# Patient Record
Sex: Male | Born: 1970 | Race: White | Hispanic: No | Marital: Married | Smoking: Former smoker
Health system: Southern US, Community
[De-identification: ages and names within clinical notes are randomized; demographics above are authoritative.]

## PROBLEM LIST (undated history)

## (undated) DIAGNOSIS — H8103 Meniere's disease, bilateral: Secondary | ICD-10-CM

## (undated) DIAGNOSIS — J45909 Unspecified asthma, uncomplicated: Secondary | ICD-10-CM

## (undated) DIAGNOSIS — Z91038 Other insect allergy status: Secondary | ICD-10-CM

## (undated) DIAGNOSIS — Z91030 Bee allergy status: Secondary | ICD-10-CM

## (undated) DIAGNOSIS — T7840XA Allergy, unspecified, initial encounter: Secondary | ICD-10-CM

## (undated) HISTORY — DX: Unspecified asthma, uncomplicated: J45.909

## (undated) HISTORY — DX: Meniere's disease, bilateral: H81.03

## (undated) HISTORY — PX: TYMPANOSTOMY TUBE PLACEMENT: SHX32

## (undated) HISTORY — DX: Other insect allergy status: Z91.038

## (undated) HISTORY — DX: Allergy, unspecified, initial encounter: T78.40XA

## (undated) HISTORY — DX: Bee allergy status: Z91.030

---

## 2009-02-01 ENCOUNTER — Emergency Department (HOSPITAL_COMMUNITY): Admission: EM | Admit: 2009-02-01 | Discharge: 2009-02-01 | Payer: Self-pay | Admitting: Family Medicine

## 2012-07-11 ENCOUNTER — Ambulatory Visit (INDEPENDENT_AMBULATORY_CARE_PROVIDER_SITE_OTHER): Payer: 59 | Admitting: Family Medicine

## 2012-07-11 VITALS — BP 130/78 | HR 61 | Temp 98.4°F | Resp 16 | Ht 72.0 in | Wt 174.0 lb

## 2012-07-11 DIAGNOSIS — H00019 Hordeolum externum unspecified eye, unspecified eyelid: Secondary | ICD-10-CM

## 2012-07-11 MED ORDER — TOBRAMYCIN 0.3 % OP SOLN: 1.0000 [drp] | Freq: Four times a day (QID) | OPHTHALMIC | Status: DC

## 2012-07-11 NOTE — Patient Instructions (Addendum)
Sty  A sty (hordeolum) is an infection of a gland in the eyelid located at the base of the eyelash. A sty may develop a white or yellow head of pus. It can be puffy (swollen). Usually, the sty will burst and pus will come out on its own. They do not leave lumps in the eyelid once they drain.  A sty is often confused with another form of cyst of the eyelid called a chalazion. Chalazions occur within the eyelid and not on the edge where the bases of the eyelashes are. They often are red, sore and then form firm lumps in the eyelid.  CAUSES    Germs (bacteria).   Lasting (chronic) eyelid inflammation.  SYMPTOMS    Tenderness, redness and swelling along the edge of the eyelid at the base of the eyelashes.   Sometimes, there is a white or yellow head of pus. It may or may not drain.  DIAGNOSIS   An ophthalmologist will be able to distinguish between a sty and a chalazion and treat the condition appropriately.   TREATMENT    Styes are typically treated with warm packs (compresses) until drainage occurs.   In rare cases, medicines that kill germs (antibiotics) may be prescribed. These antibiotics may be in the form of drops, cream or pills.   If a hard lump has formed, it is generally necessary to do a small incision and remove the hardened contents of the cyst in a minor surgical procedure done in the office.   In suspicious cases, your caregiver may send the contents of the cyst to the lab to be certain that it is not a rare, but dangerous form of cancer of the glands of the eyelid.  HOME CARE INSTRUCTIONS    Wash your hands often and dry them with a clean towel. Avoid touching your eyelid. This may spread the infection to other parts of the eye.   Apply heat to your eyelid for 10 to 20 minutes, several times a day, to ease pain and help to heal it faster.   Do not squeeze the sty. Allow it to drain on its own. Wash your eyelid carefully 3 to 4 times per day to remove any pus.  SEEK IMMEDIATE MEDICAL CARE IF:     Your eye becomes painful or puffy (swollen).   Your vision changes.   Your sty does not drain by itself within 3 days.   Your sty comes back within a short period of time, even with treatment.   You have redness (inflammation) around the eye.   You have a fever.  Document Released: 04/21/2005 Document Revised: 10/04/2011 Document Reviewed: 12/24/2008  [employer] Patient Information 2013 [employer].

## 2012-07-11 NOTE — Progress Notes (Signed)
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 This is uncomfortable.  Objective:  NAD Mildly swollen lower lid with erythema (left side, lateral lid margin)  flourescein negative EOM I PERLA  Assessment: small stye

## 2012-12-20 ENCOUNTER — Ambulatory Visit (INDEPENDENT_AMBULATORY_CARE_PROVIDER_SITE_OTHER): Payer: 59 | Admitting: Family Medicine

## 2012-12-20 VITALS — BP 120/80 | HR 62 | Temp 98.1°F | Resp 16 | Ht 71.0 in | Wt 169.8 lb

## 2012-12-20 DIAGNOSIS — R05 Cough: Secondary | ICD-10-CM

## 2012-12-20 DIAGNOSIS — J309 Allergic rhinitis, unspecified: Secondary | ICD-10-CM

## 2012-12-20 DIAGNOSIS — R059 Cough, unspecified: Secondary | ICD-10-CM

## 2012-12-20 DIAGNOSIS — J019 Acute sinusitis, unspecified: Secondary | ICD-10-CM

## 2012-12-20 NOTE — Progress Notes (Signed)
[facility]:  Office Visit  Chief Complaint:  Chief Complaint  Patient presents with  . Sinusitis    drainage; very congested x 1 week   . Headache  . Cough    green- yellow sputum    HPI: Brad Price is a 42 y.o. male who complains of  2 week history of cough with green sputum and also sinus pressure and facial pain that has worsened in the last 1 week.  History of asthma, allergies. + Frontal HA. Has tried Mucinex and Flonase and Dulera without improvement. Former smoker. Denies SOB/wheezes, CP  Past Medical History  Diagnosis Date  . Allergy   . Asthma    History reviewed. No pertinent past surgical history. History   Social History  . Marital Status: Married    Spouse Name: N/A    Number of Children: N/A  . Years of Education: N/A   Social History Main Topics  . Smoking status: Former Games developer  . Smokeless tobacco: None  . Alcohol Use: Yes  . Drug Use: No  . Sexually Active: Yes   Other Topics Concern  . None   Social History Narrative  . None   Family History  Problem Relation Age of Onset  . COPD Father   . Cancer Maternal Grandfather   . Heart disease Paternal Grandfather    No Known Allergies Prior to Admission medications   Medication Sig Start Date End Date Taking? Authorizing Provider  fluticasone (FLONASE) 50 MCG/ACT nasal spray Place 2 sprays into the nose daily.   Yes Historical Provider, MD  mometasone-formoterol (DULERA) 100-5 MCG/ACT AERO Inhale 2 puffs into the lungs.   Yes Historical Provider, MD  tobramycin (TOBREX) 0.3 % ophthalmic solution Place 1 drop into the left eye every 6 (six) hours. 07/11/12   Elvina Sidle, MD     ROS: The patient denies night sweats, unintentional weight loss, chest pain, palpitations, wheezing, dyspnea on exertion, nausea, vomiting, abdominal pain, dysuria, hematuria, melena, numbness, weakness, or tingling.   All other systems have been reviewed and were otherwise negative with the  exception of those mentioned in the HPI and as above.    PHYSICAL EXAM: Filed Vitals:   12/20/12 1823  BP: 120/80  Pulse: 62  Temp: 98.1 F (36.7 C)  Resp: 16  Spo2   98% Filed Vitals:   12/20/12 1823  Height: 5\' 11"  (1.803 m)  Weight: 169 lb 12.8 oz (77.021 kg)   Body mass index is 23.69 kg/(m^2).  General: Alert, no acute distress HEENT:  Normocephalic, atraumatic, oropharynx patent. + Tm normal with scarring. + boggy nares, + facial and sinus tenderness bilaterally. No exudates. Cardiovascular:  Regular rate and rhythm, no rubs murmurs or gallops.  No Carotid bruits, radial pulse intact. No pedal edema.  Respiratory: Clear to auscultation bilaterally.  No wheezes, rales, or rhonchi.  No cyanosis, no use of accessory musculature GI: No organomegaly, abdomen is soft and non-tender, positive bowel sounds.  No masses. Skin: No rashes. Neurologic: Facial musculature symmetric. Psychiatric: Patient is appropriate throughout our interaction. Lymphatic: No cervical lymphadenopathy Musculoskeletal: Gait intact.   LABS: No results found for this or any previous visit.   EKG/XRAY:   Primary read interpreted by Dr. Conley Rolls at [facility]   ASSESSMENT/PLAN: Encounter Diagnoses  Name Primary?  . Acute sinusitis Yes  . Allergic rhinitis   . Cough    Rx Augmentin OTC mucinex Declined rx cough meds C/w Flonase, Dulera Gross sideeffects, risk and benefits, and alternatives  of medications d/w patient. Patient is aware that all medications have potential sideeffects and we are unable to predict every sideeffect or drug-drug interaction that may occur. F/u prn    LE, THAO PHUONG, DO 12/21/2012 7:09 AM

## 2012-12-20 NOTE — Patient Instructions (Signed)

## 2012-12-28 ENCOUNTER — Ambulatory Visit (INDEPENDENT_AMBULATORY_CARE_PROVIDER_SITE_OTHER): Payer: 59 | Admitting: Family Medicine

## 2012-12-28 VITALS — BP 110/81 | HR 81 | Temp 98.1°F | Resp 18 | Ht 71.0 in | Wt 176.0 lb

## 2012-12-28 DIAGNOSIS — J209 Acute bronchitis, unspecified: Secondary | ICD-10-CM

## 2012-12-28 DIAGNOSIS — J309 Allergic rhinitis, unspecified: Secondary | ICD-10-CM

## 2012-12-28 DIAGNOSIS — J019 Acute sinusitis, unspecified: Secondary | ICD-10-CM

## 2012-12-28 DIAGNOSIS — R0989 Other specified symptoms and signs involving the circulatory and respiratory systems: Secondary | ICD-10-CM

## 2012-12-28 MED ORDER — IPRATROPIUM BROMIDE 0.03 % NA SOLN: 2.0000 | Freq: Two times a day (BID) | NASAL | Status: DC

## 2012-12-28 MED ORDER — LEVOFLOXACIN 500 MG PO TABS
500.0000 mg | ORAL_TABLET | Freq: Every day | ORAL | Status: DC
Start: 2012-12-28 — End: 2014-03-03

## 2012-12-28 MED ORDER — METHYLPREDNISOLONE (PAK) 4 MG PO TABS: ORAL_TABLET | ORAL | Status: DC

## 2012-12-28 NOTE — Progress Notes (Signed)
[facility]:  Office Visit  Chief Complaint:  Chief Complaint  Patient presents with  . Follow-up    last weeks illness    HPI: Brad Price is a 42 y.o. male who complains of similar after being treated for sinusitis which started  3 weeks ago. He was placed on  abx about 1 week ago and sxs have not gotten better. Not better on Augmentin, Flonase, Mucinex D. Similar sxs, feels more in his chest.  Cough with green sputum and also sinus pressure and facial pain that has worsened in the last 1 week. History of asthma, allergies. + Frontal HA.  Former smoker. Denies SOB/wheezes, CP.    Past Medical History  Diagnosis Date  . Allergy   . Asthma    No past surgical history on file. History   Social History  . Marital Status: Married    Spouse Name: N/A    Number of Children: N/A  . Years of Education: N/A   Social History Main Topics  . Smoking status: Former Games developer  . Smokeless tobacco: None  . Alcohol Use: Yes  . Drug Use: No  . Sexually Active: Yes   Other Topics Concern  . None   Social History Narrative  . None   Family History  Problem Relation Age of Onset  . COPD Father   . Cancer Maternal Grandfather   . Heart disease Paternal Grandfather    No Known Allergies Prior to Admission medications   Medication Sig Start Date End Date Taking? Authorizing Provider  fluticasone (FLONASE) 50 MCG/ACT nasal spray Place 2 sprays into the nose daily.   Yes Historical Provider, MD  mometasone-formoterol (DULERA) 100-5 MCG/ACT AERO Inhale 2 puffs into the lungs.   Yes Historical Provider, MD  tobramycin (TOBREX) 0.3 % ophthalmic solution Place 1 drop into the left eye every 6 (six) hours. 07/11/12   Elvina Sidle, MD     ROS: The patient denies fevers, chills, night sweats, unintentional weight loss, chest pain, palpitations, wheezing, dyspnea on exertion, nausea, vomiting, abdominal pain, dysuria, hematuria, melena, numbness, weakness, or tingling.    All other systems have been reviewed and were otherwise negative with the exception of those mentioned in the HPI and as above.    PHYSICAL EXAM: Filed Vitals:   12/28/12 1635  BP: 110/81  Pulse: 81  Temp: 98.1 F (36.7 C)  Resp: 18   Filed Vitals:   12/28/12 1635  Height: 5\' 11"  (1.803 m)  Weight: 176 lb (79.833 kg)   Body mass index is 24.56 kg/(m^2).  General: Alert, no acute distress HEENT:  Normocephalic, atraumatic, oropharynx patent. EOMI, PERRLA, + moderate sinus tenderness, TM boggy, bulging.  No exudates Cardiovascular:  Regular rate and rhythm, no rubs murmurs or gallops.  No Carotid bruits, radial pulse intact. No pedal edema.  Respiratory: Clear to auscultation bilaterally.  No wheezes, rales, or rhonchi.  No cyanosis, no use of accessory musculature GI: No organomegaly, abdomen is soft and non-tender, positive bowel sounds.  No masses. Skin: No rashes. Neurologic: Facial musculature symmetric. Psychiatric: Patient is appropriate throughout our interaction. Lymphatic: No cervical lymphadenopathy Musculoskeletal: Gait intact.   LABS: No results found for this or any previous visit.   EKG/XRAY:   Primary read interpreted by Dr. Conley Rolls at [facility].   ASSESSMENT/PLAN: Encounter Diagnoses  Name Primary?  . Acute sinusitis Yes  . Acute bronchitis   . Chest congestion   . Allergic rhinitis    DC augmentin, stop flonase if  using atrovent NS Rx levaquin Rx atrovent NS Rx Medrol dose pack F/u prn    Tnya Ades PHUONG, DO 12/28/2012 5:13 PM

## 2013-05-07 ENCOUNTER — Encounter (INDEPENDENT_AMBULATORY_CARE_PROVIDER_SITE_OTHER): Payer: Self-pay | Admitting: General Surgery

## 2013-07-26 DIAGNOSIS — H8103 Meniere's disease, bilateral: Secondary | ICD-10-CM

## 2013-07-26 HISTORY — DX: Meniere's disease, bilateral: H81.03

## 2014-03-03 ENCOUNTER — Ambulatory Visit (INDEPENDENT_AMBULATORY_CARE_PROVIDER_SITE_OTHER): Payer: 59 | Admitting: Family Medicine

## 2014-03-03 VITALS — BP 106/70 | HR 70 | Temp 98.7°F | Resp 12 | Ht 71.0 in | Wt 171.2 lb

## 2014-03-03 DIAGNOSIS — R51 Headache: Secondary | ICD-10-CM

## 2014-03-03 DIAGNOSIS — H9209 Otalgia, unspecified ear: Secondary | ICD-10-CM

## 2014-03-03 DIAGNOSIS — H9202 Otalgia, left ear: Secondary | ICD-10-CM

## 2014-03-03 DIAGNOSIS — H9319 Tinnitus, unspecified ear: Secondary | ICD-10-CM

## 2014-03-03 DIAGNOSIS — J309 Allergic rhinitis, unspecified: Secondary | ICD-10-CM

## 2014-03-03 NOTE — Progress Notes (Signed)
Subjective:    Patient ID: Brad Price, male    DOB: Sep 02, 1970, 43 y.o.   MRN: [id number]  Sinusitis Associated symptoms include headaches. Pertinent negatives include no chills, congestion or sinus pressure.   Chief Complaint  Patient presents with  . Sinusitis    This chart was scribed for [name], MD by [name], ED Scribe. This patient was seen in room 11 and the patient's care was started at 2:44 PM.  HPI Comments: Brad Price is a 43 y.o. male who presents to the [facility] complaining of possible sinuitis. Last seen may 28th with 2 week h/o cough, green sputum, sinus pressure and facial pain for 1 week at that time. Pt was treated with Augmentin. Pt was seen again June 5th after no improvement 1 week prior. Augmentin was discontinued and started in Levaquin Atrovent nasal spray and medrol dose pack.   Today pt c/o possible sinusitis that began about 1.5 weeks . Pt reports he was recently stung by a yellow 1 week ago on right arm. Pt states he is allergic to  yellow jackets. Pt reports a new cat at home 5 day ago which he is allergic to as well. Pt has had a cat  in that past about 1 year ago.  Pt now has worsening symptoms including pressure behind bilateral ears described as blockage, hearing loss, a pressure HA, and dizzy spells. He reports he has had constant blocking in left ear for 3 days with associated tinnitus. Pt reports blockage in left ear is worse than right. Pt has restarted taking flonase and dulera onset symptoms. Pt also uses rescue inhaler in which he uses once or twice a day.  Pt has an allergist,  [name] and last seen him 1 year ago. Pt denies weakness, tremor, syncope, trouble with speech, seizures. He denies fever chills, and sinus pressure.  Past Medical History  Diagnosis Date  . Allergy   . Asthma    History reviewed. No pertinent past surgical history. Prior to Admission medications   Medication Sig Start Date End Date  Taking? Authorizing Provider  fluticasone (FLONASE) 50 MCG/ACT nasal spray Place 2 sprays into the nose daily.   Yes Historical Provider, MD  mometasone-formoterol (DULERA) 100-5 MCG/ACT AERO Inhale 2 puffs into the lungs.   Yes Historical Provider, MD  ipratropium (ATROVENT) 0.03 % nasal spray Place 2 sprays into the nose every 12 (twelve) hours. 12/28/12   Thao P Le, DO  tobramycin (TOBREX) 0.3 % ophthalmic solution Place 1 drop into the left eye every 6 (six) hours. 07/11/12   Robyn Haber, MD   Review of Systems  Constitutional: Negative for fever and chills.  HENT: Positive for hearing loss and tinnitus. Negative for congestion and sinus pressure.   Eyes: Negative for visual disturbance.  Allergic/Immunologic: Positive for environmental allergies.  Neurological: Positive for dizziness and headaches. Negative for tremors, seizures, syncope, facial asymmetry, speech difficulty, weakness, light-headedness and numbness.       Objective:   Physical Exam  Vitals reviewed. Constitutional: He is oriented to person, place, and time. He appears well-developed and well-nourished.  HENT:  Head: Normocephalic and atraumatic.  Right Ear: External ear and ear canal normal. No drainage or swelling. Tympanic membrane is scarred ( 8-9 ocloack). Tympanic membrane is not erythematous.  Left Ear: External ear and ear canal normal. No drainage or swelling. Tympanic membrane is scarred ( at 8 and 3 oclock ). Tympanic membrane is not erythematous.  Nose:  Mucosal edema present. No rhinorrhea. Right sinus exhibits no maxillary sinus tenderness and no frontal sinus tenderness. Left sinus exhibits no maxillary sinus tenderness and no frontal sinus tenderness.  Mouth/Throat: Oropharynx is clear and moist and mucous membranes are normal. No oropharyngeal exudate or posterior oropharyngeal erythema.  Left TM- Fluid at the base Base of TM no redness discharge or rupture  Eyes: Conjunctivae are normal. Pupils are  equal, round, and reactive to light.  Neck: Neck supple.  Cardiovascular: Normal rate, regular rhythm, normal heart sounds and intact distal pulses.   No murmur heard. Pulmonary/Chest: Effort normal and breath sounds normal. He has no wheezes. He has no rhonchi. He has no rales.  Abdominal: Soft. There is no tenderness.  Lymphadenopathy:    He has no cervical adenopathy.  Neurological: He is alert and oriented to person, place, and time.  Negative romberg, negative heal to toe. Negative focal exam  Skin: Skin is warm and dry. No rash noted.  Psychiatric: He has a normal mood and affect. His behavior is normal.   Filed Vitals:   03/03/14 1423  BP: 106/70  Pulse: 70  Temp: 98.7 F (37.1 C)  TempSrc: Oral  Resp: 12  Height: 5\' 11"  (1.803 m)  Weight: 171 lb 4 oz (77.678 kg)  SpO2: 98%     Assessment & Plan:  [name] is a 43 y.o. male Otalgia, left - Plan: Ambulatory referral to ENT  Headache(784.0) - Plan: Ambulatory referral to ENT  Tinnitus, unspecified laterality - Plan: Ambulatory referral to ENT  Allergic rhinitis, unspecified allergic rhinitis type - Plan: Ambulatory referral to ENT  Suspected underlying allergic rhinitis, with secondary ETD, however with tinnitus and episodic dizziness, labyrinthitis, Menier's in differential or less likely acoustic neuroma. Affected ear of fullness has better hearing on audiometry.   -Can try Afrin short term OR mucinex D, continue Flonase, add Allegra.   -Refer to ENT.  -ER/RTC precautions if any worsening sooner.   No orders of the defined types were placed in this encounter.   Patient Instructions  Your left ear hearing test is actually better than the right. We can have you evaluated with Ear Nose and Throat for this, but your current symptoms may be related to "eustachian tube dysfunction" and the allergies.  Continue flonase, start allegra once per day. Trial of Afrin nasal spray - no more than 3 days. Mucinex or Mucinex  D(has sudafed in it) if needed   If not improving this week, or any worsening of pain, dizziness headache, or if you have fevers - return here or emergency room.  (Return to the clinic or go to the nearest emergency room if any of your symptoms worsen or new symptoms occur).   Barotitis Media Barotitis media is inflammation of your middle ear. This occurs when the auditory tube (eustachian tube) leading from the back of your nose (nasopharynx) to your eardrum is blocked. This blockage may result from a cold, environmental allergies, or an upper respiratory infection. Unresolved barotitis media may lead to damage or hearing loss (barotrauma), which may become permanent. HOME CARE INSTRUCTIONS   Use medicines as recommended by your health care provider. Over-the-counter medicines will help unblock the canal and can help during times of air travel.  Do not put anything into your ears to clean or unplug them. Eardrops will not be helpful.  Do not swim, dive, or fly until your health care provider says it is all right to do so. If these activities are necessary,  chewing gum with frequent, forceful swallowing may help. It is also helpful to hold your nose and gently blow to pop your ears for equalizing pressure changes. This forces air into the eustachian tube.  Only take over-the-counter or prescription medicines for pain, discomfort, or fever as directed by your health care provider.  A decongestant may be helpful in decongesting the middle ear and make pressure equalization easier. SEEK MEDICAL CARE IF:  You experience a serious form of dizziness in which you feel as if the room is spinning and you feel nauseated (vertigo).  Your symptoms only involve one ear. SEEK IMMEDIATE MEDICAL CARE IF:   You develop a severe headache, dizziness, or severe ear pain.  You have bloody or pus-like drainage from your ears.  You develop a fever.  Your problems do not improve or become worse. MAKE SURE YOU:    Understand these instructions.  Will watch your condition.  Will get help right away if you are not doing well or get worse. Document Released: 07/09/2000 Document Revised: 05/02/2013 Document Reviewed: 02/06/2013 [facility] Patient Information 2015 [place], [place]. This information is not intended to replace advice given to you by your health care provider. Make sure you discuss any questions you have with your health care provider.    I personally performed the services described in this documentation, which was scribed in my presence. The recorded information has been reviewed and considered, and addended by me as needed.

## 2014-03-03 NOTE — Patient Instructions (Addendum)
Your left ear hearing test is actually better than the right. We can have you evaluated with Ear Nose and Throat for this, but your current symptoms may be related to "eustachian tube dysfunction" and the allergies.  Continue flonase, start allegra once per day. Trial of Afrin nasal spray - no more than 3 days. Mucinex or Mucinex D(has sudafed in it) if needed   If not improving this week, or any worsening of pain, dizziness headache, or if you have fevers - return here or emergency room.  (Return to the clinic or go to the nearest emergency room if any of your symptoms worsen or new symptoms occur).   Barotitis Media Barotitis media is inflammation of your middle ear. This occurs when the auditory tube (eustachian tube) leading from the back of your nose (nasopharynx) to your eardrum is blocked. This blockage may result from a cold, environmental allergies, or an upper respiratory infection. Unresolved barotitis media may lead to damage or hearing loss (barotrauma), which may become permanent. HOME CARE INSTRUCTIONS   Use medicines as recommended by your health care provider. Over-the-counter medicines will help unblock the canal and can help during times of air travel.  Do not put anything into your ears to clean or unplug them. Eardrops will not be helpful.  Do not swim, dive, or fly until your health care provider says it is all right to do so. If these activities are necessary, chewing gum with frequent, forceful swallowing may help. It is also helpful to hold your nose and gently blow to pop your ears for equalizing pressure changes. This forces air into the eustachian tube.  Only take over-the-counter or prescription medicines for pain, discomfort, or fever as directed by your health care provider.  A decongestant may be helpful in decongesting the middle ear and make pressure equalization easier. SEEK MEDICAL CARE IF:  You experience a serious form of dizziness in which you feel as if the  room is spinning and you feel nauseated (vertigo).  Your symptoms only involve one ear. SEEK IMMEDIATE MEDICAL CARE IF:   You develop a severe headache, dizziness, or severe ear pain.  You have bloody or pus-like drainage from your ears.  You develop a fever.  Your problems do not improve or become worse. MAKE SURE YOU:   Understand these instructions.  Will watch your condition.  Will get help right away if you are not doing well or get worse. Document Released: 07/09/2000 Document Revised: 05/02/2013 Document Reviewed: 02/06/2013 [facility] Patient Information 2015 [place], [place]. This information is not intended to replace advice given to you by your health care provider. Make sure you discuss any questions you have with your health care provider.

## 2014-03-04 ENCOUNTER — Emergency Department (HOSPITAL_COMMUNITY)
Admission: EM | Admit: 2014-03-04 | Discharge: 2014-03-04 | Disposition: A | Discharge status: Home / Self Care | Payer: 59 | Attending: Emergency Medicine | Admitting: Emergency Medicine

## 2014-03-04 ENCOUNTER — Encounter (HOSPITAL_COMMUNITY): Payer: Self-pay | Admitting: Emergency Medicine

## 2014-03-04 DIAGNOSIS — Z791 Long term (current) use of non-steroidal anti-inflammatories (NSAID): Secondary | ICD-10-CM | POA: Insufficient documentation

## 2014-03-04 DIAGNOSIS — R42 Dizziness and giddiness: Secondary | ICD-10-CM | POA: Diagnosis present

## 2014-03-04 DIAGNOSIS — R609 Edema, unspecified: Secondary | ICD-10-CM | POA: Diagnosis not present

## 2014-03-04 DIAGNOSIS — Z79899 Other long term (current) drug therapy: Secondary | ICD-10-CM | POA: Insufficient documentation

## 2014-03-04 DIAGNOSIS — Z9109 Other allergy status, other than to drugs and biological substances: Secondary | ICD-10-CM | POA: Diagnosis not present

## 2014-03-04 DIAGNOSIS — J45909 Unspecified asthma, uncomplicated: Secondary | ICD-10-CM | POA: Insufficient documentation

## 2014-03-04 DIAGNOSIS — Z87891 Personal history of nicotine dependence: Secondary | ICD-10-CM | POA: Diagnosis not present

## 2014-03-04 LAB — CBC WITH DIFFERENTIAL/PLATELET
BASOS PCT: 1 % (ref 0–1)
Basophils Absolute: 0 10*3/uL (ref 0.0–0.1)
Eosinophils Absolute: 0.3 10*3/uL (ref 0.0–0.7)
Eosinophils Relative: 6 % — ABNORMAL HIGH (ref 0–5)
HEMATOCRIT: 44.4 % (ref 39.0–52.0)
HEMOGLOBIN: 15.3 g/dL (ref 13.0–17.0)
LYMPHS ABS: 2.1 10*3/uL (ref 0.7–4.0)
Lymphocytes Relative: 38 % (ref 12–46)
MCH: 31.5 pg (ref 26.0–34.0)
MCHC: 34.5 g/dL (ref 30.0–36.0)
MCV: 91.5 fL (ref 78.0–100.0)
MONO ABS: 0.6 10*3/uL (ref 0.1–1.0)
MONOS PCT: 11 % (ref 3–12)
NEUTROS ABS: 2.5 10*3/uL (ref 1.7–7.7)
Neutrophils Relative %: 44 % (ref 43–77)
Platelets: 236 10*3/uL (ref 150–400)
RBC: 4.85 MIL/uL (ref 4.22–5.81)
RDW: 12.3 % (ref 11.5–15.5)
WBC: 5.5 10*3/uL (ref 4.0–10.5)

## 2014-03-04 LAB — BASIC METABOLIC PANEL
Anion gap: 14 (ref 5–15)
BUN: 14 mg/dL (ref 6–23)
CO2: 23 mEq/L (ref 19–32)
CREATININE: 0.73 mg/dL (ref 0.50–1.35)
Calcium: 10 mg/dL (ref 8.4–10.5)
Chloride: 103 mEq/L (ref 96–112)
GFR calc non Af Amer: >90 mL/min (ref >90)
Glucose, Bld: 120 mg/dL — ABNORMAL HIGH (ref 70–99)
Potassium: 4.1 mEq/L (ref 3.7–5.3)
Sodium: 140 mEq/L (ref 137–147)

## 2014-03-04 MED ORDER — KETOROLAC TROMETHAMINE 30 MG/ML IJ SOLN
30.0000 mg | Freq: Once | INTRAMUSCULAR | Status: AC
  Administered 2014-03-04: 30 mg via INTRAVENOUS
  Filled 2014-03-04: qty 1

## 2014-03-04 MED ORDER — MECLIZINE HCL 25 MG PO TABS
25.0000 mg | ORAL_TABLET | Freq: Once | ORAL | Status: AC
  Administered 2014-03-04: 25 mg via ORAL
  Filled 2014-03-04: qty 1

## 2014-03-04 MED ORDER — SODIUM CHLORIDE 0.9 % IV BOLUS (SEPSIS)
1000.0000 mL | Freq: Once | INTRAVENOUS | Status: AC
  Administered 2014-03-04: 1000 mL via INTRAVENOUS

## 2014-03-04 MED ORDER — MECLIZINE HCL 25 MG PO TABS: 25.0000 mg | ORAL_TABLET | Freq: Three times a day (TID) | ORAL | Status: DC | PRN

## 2014-03-04 MED ORDER — METOCLOPRAMIDE HCL 5 MG/ML IJ SOLN
10.0000 mg | Freq: Once | INTRAMUSCULAR | Status: AC
  Administered 2014-03-04: 10 mg via INTRAVENOUS
  Filled 2014-03-04: qty 2

## 2014-03-04 MED ORDER — DIAZEPAM 5 MG PO TABS
5.0000 mg | ORAL_TABLET | Freq: Once | ORAL | Status: AC
  Administered 2014-03-04: 5 mg via ORAL
  Filled 2014-03-04: qty 1

## 2014-03-04 MED ORDER — ONDANSETRON HCL 4 MG/2ML IJ SOLN
4.0000 mg | Freq: Once | INTRAMUSCULAR | Status: AC
  Administered 2014-03-04: 4 mg via INTRAVENOUS
  Filled 2014-03-04: qty 2

## 2014-03-04 NOTE — ED Provider Notes (Signed)
CSN: [id number]     Arrival date & time 03/04/14  1507 History   First MD Initiated Contact with Patient 03/04/14 1547     Chief Complaint  Patient presents with  . Dizziness     (Consider location/radiation/quality/duration/timing/severity/associated sxs/prior Treatment) HPI Comments: Patient presents today with a chief complaint of dizziness.  He describes the dizziness as a feeling like the room is spinning.  He reports that he has been having these episodes intermittently over the past week. Symptoms worsen when turning his head.   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  He reports associated nausea, vomiting, frontal headache, and nasal congestion.  He reports one episode of vomiting earlier today.  He also reports that his hearing is decreased in the left ear and he feels like there is a blockage.  He is also complaining of tinnitus of the left ear.  He was seen at [facility] yesterday for these symptoms and was diagnosed with an Acute Sinusitis.  He was told to use Afrin nasal spray and Mucinex, which he has without improvement.  He denies focal weakness, double vision, fever, chills, chest pain, SOB, or syncope.  He denies any prior history of CVA or TIA.  No history of HTN, Hyperlipidemia, or DM.  He does not smoke.    The history is provided by the patient.    Past Medical History  Diagnosis Date  . Allergy   . Asthma    No past surgical history on file. Family History  Problem Relation Age of Onset  . COPD Father   . Heart disease Father   . Stroke Father   . Cancer Maternal Grandfather   . Heart disease Paternal Grandfather   . Stroke Paternal Grandmother    History  Substance Use Topics  . Smoking status: Former Research scientist (life sciences)  . Smokeless tobacco: Former Systems developer  . Alcohol Use: 1.0 - 1.5 oz/week    2-3 drink(s) per week    Review of Systems  All other systems reviewed and are negative.     Allergies  Review of patient's allergies indicates  no known allergies.  Home Medications   Prior to Admission medications   Medication Sig Start Date End Date Taking? Authorizing Provider  fexofenadine (ALLEGRA) 180 MG tablet Take 180 mg by mouth daily.   Yes Historical Provider, MD  fluticasone (FLONASE) 50 MCG/ACT nasal spray Place 2 sprays into the nose daily.   Yes Historical Provider, MD  guaiFENesin (MUCINEX) 600 MG 12 hr tablet Take 600 mg by mouth 2 (two) times daily as needed for cough.   Yes Historical Provider, MD  ibuprofen (ADVIL,MOTRIN) 200 MG tablet Take 400 mg by mouth every 6 (six) hours as needed for headache.   Yes Historical Provider, MD  mometasone-formoterol (DULERA) 100-5 MCG/ACT AERO Inhale 2 puffs into the lungs 2 (two) times daily.    Yes Historical Provider, MD  oxymetazoline (AFRIN) 0.05 % nasal spray Place 1 spray into both nostrils 2 (two) times daily as needed for congestion.   Yes Historical Provider, MD  pseudoephedrine-acetaminophen (TYLENOL SINUS) 30-500 MG TABS Take 1 tablet by mouth every 4 (four) hours as needed (congestion).   Yes Historical Provider, MD   BP 122/91  Pulse 57  Temp(Src) 98.1 F (36.7 C) (Oral)  Resp 16  Ht 5\' 11"  (1.803 m)  Wt 170 lb (77.111 kg)  BMI 23.72 kg/m2  SpO2 100% Physical Exam  Nursing note and vitals reviewed. Constitutional: He  appears well-developed and well-nourished.  HENT:  Head: Normocephalic and atraumatic.  Right Ear: Tympanic membrane and ear canal normal.  Left Ear: Tympanic membrane and ear canal normal.  Nose: Mucosal edema present. Right sinus exhibits no maxillary sinus tenderness and no frontal sinus tenderness. Left sinus exhibits no maxillary sinus tenderness and no frontal sinus tenderness.  Mouth/Throat: Oropharynx is clear and moist.  Scarring of the TM bilaterally   Eyes: EOM are normal. Pupils are equal, round, and reactive to light.  Neck: Normal range of motion. Neck supple.  Cardiovascular: Normal rate, regular rhythm and normal heart  sounds.   Pulmonary/Chest: Effort normal and breath sounds normal.  Musculoskeletal: Normal range of motion.  Neurological: He is alert. He has normal strength. No cranial nerve deficit or sensory deficit. Coordination and gait normal.  Normal finger to nose testing Normal rapid alternating movements  Skin: Skin is warm and dry.  Psychiatric: He has a normal mood and affect.    ED Course  Procedures (including critical care time) Labs Review Labs Reviewed  CBC WITH DIFFERENTIAL  BASIC METABOLIC PANEL    Imaging Review No results found.   EKG Interpretation None     6:16 PM Reassessed patient.  He reports that his symptoms have improved at this time.  Normal gait, no ataxia. MDM   Final diagnoses:  None   Patient with a history of Vertigo presents today with Vertigo that has been occuring intermittently over the past week.  He was seen at [facility] yesterday for the same symptoms and was diagnosed with an Acute Sinusitis.  Symptoms consistent with Vertigo.  Due to the fact that he also has nasal congestion and a feeling like there is a fluid blockage of the left ear, feel that this could be contributing as well.  Patient with a normal neurological exam.  Symptoms improved after given Meclizine.  No risk factors for CVA.  Therefore, feel that the patient is stable for discharge.  Return precautions given.      Hyman Bible, PA-C 03/06/14 [phone]

## 2014-03-04 NOTE — ED Notes (Addendum)
Pt states he couldn't hear out of his left ear last Tuesday. It persisted so he went to the urgent care yesterday. The dizziness started intermittently last Thursday and has gotten worse today. Pt has vomited x1 today.  Hx of the same last year. Worse with standing. Pt pale on assessment. No diaphoresis. BP stable 122/91

## 2014-03-04 NOTE — ED Provider Notes (Signed)
Medical screening examination/treatment/procedure(s) were performed by non-physician practitioner and as supervising physician I was immediately available for consultation/collaboration.   EKG Interpretation None        Debby Freiberg, MD 03/05/14 1456

## 2014-03-09 NOTE — ED Provider Notes (Signed)
Medical screening examination/treatment/procedure(s) were performed by non-physician practitioner and as supervising physician I was immediately available for consultation/collaboration.   EKG Interpretation None        Debby Freiberg, MD 03/09/14 1506

## 2014-03-21 ENCOUNTER — Other Ambulatory Visit: Payer: Self-pay | Admitting: Otolaryngology

## 2014-03-21 DIAGNOSIS — R42 Dizziness and giddiness: Secondary | ICD-10-CM

## 2014-03-21 DIAGNOSIS — H9319 Tinnitus, unspecified ear: Secondary | ICD-10-CM

## 2014-03-25 ENCOUNTER — Encounter: Payer: Self-pay | Admitting: Family Medicine

## 2014-03-25 ENCOUNTER — Ambulatory Visit (INDEPENDENT_AMBULATORY_CARE_PROVIDER_SITE_OTHER): Payer: 59 | Admitting: Family Medicine

## 2014-03-25 VITALS — BP 106/74 | HR 82 | Temp 98.0°F | Resp 16 | Ht 71.0 in | Wt 169.2 lb

## 2014-03-25 DIAGNOSIS — R9431 Abnormal electrocardiogram [ECG] [EKG]: Secondary | ICD-10-CM

## 2014-03-25 DIAGNOSIS — Z0000 Encounter for general adult medical examination without abnormal findings: Secondary | ICD-10-CM

## 2014-03-25 DIAGNOSIS — J45909 Unspecified asthma, uncomplicated: Secondary | ICD-10-CM

## 2014-03-25 DIAGNOSIS — Z8249 Family history of ischemic heart disease and other diseases of the circulatory system: Secondary | ICD-10-CM

## 2014-03-25 DIAGNOSIS — J683 Other acute and subacute respiratory conditions due to chemicals, gases, fumes and vapors: Secondary | ICD-10-CM

## 2014-03-25 LAB — CBC WITH DIFFERENTIAL/PLATELET
Basophils Absolute: 0.1 10*3/uL (ref 0.0–0.1)
Basophils Relative: 2 % — ABNORMAL HIGH (ref 0–1)
Eosinophils Absolute: 0.2 10*3/uL (ref 0.0–0.7)
Eosinophils Relative: 6 % — ABNORMAL HIGH (ref 0–5)
HCT: 42.2 % (ref 39.0–52.0)
Hemoglobin: 15.5 g/dL (ref 13.0–17.0)
Lymphocytes Relative: 34 % (ref 12–46)
Lymphs Abs: 1.3 10*3/uL (ref 0.7–4.0)
MCH: 32.8 pg (ref 26.0–34.0)
MCHC: 36.7 g/dL — ABNORMAL HIGH (ref 30.0–36.0)
MCV: 89.2 fL (ref 78.0–100.0)
Monocytes Absolute: 0.4 10*3/uL (ref 0.1–1.0)
Monocytes Relative: 11 % (ref 3–12)
Neutro Abs: 1.7 10*3/uL (ref 1.7–7.7)
Neutrophils Relative %: 47 % (ref 43–77)
Platelets: 224 10*3/uL (ref 150–400)
RBC: 4.73 MIL/uL (ref 4.22–5.81)
RDW: 12.8 % (ref 11.5–15.5)
WBC: 3.7 10*3/uL — ABNORMAL LOW (ref 4.0–10.5)

## 2014-03-25 LAB — COMPLETE METABOLIC PANEL WITH GFR
ALT: 16 U/L (ref 0–53)
AST: 15 U/L (ref 0–37)
Albumin: 4.4 g/dL (ref 3.5–5.2)
Alkaline Phosphatase: 45 U/L (ref 39–117)
BUN: 17 mg/dL (ref 6–23)
CO2: 26 mEq/L (ref 19–32)
Calcium: 9.6 mg/dL (ref 8.4–10.5)
Chloride: 105 mEq/L (ref 96–112)
Creat: 0.8 mg/dL (ref 0.50–1.35)
GFR, Est African American: >89 mL/min
GFR, Est Non African American: >89 mL/min
Glucose, Bld: 86 mg/dL (ref 70–99)
Potassium: 4 mEq/L (ref 3.5–5.3)
Sodium: 139 mEq/L (ref 135–145)
Total Bilirubin: 1.3 mg/dL — ABNORMAL HIGH (ref 0.2–1.2)
Total Protein: 6.6 g/dL (ref 6.0–8.3)

## 2014-03-25 LAB — POCT URINALYSIS DIPSTICK
Bilirubin, UA: NEGATIVE
Blood, UA: NEGATIVE
Glucose, UA: NEGATIVE
Ketones, UA: NEGATIVE
Leukocytes, UA: NEGATIVE
Nitrite, UA: NEGATIVE
Protein, UA: NEGATIVE
Spec Grav, UA: 1.015
Urobilinogen, UA: 0.2
pH, UA: 6.5

## 2014-03-25 LAB — LIPID PANEL
Cholesterol: 199 mg/dL (ref 0–200)
HDL: 53 mg/dL (ref >39)
LDL Cholesterol: 120 mg/dL — ABNORMAL HIGH (ref 0–99)
Total CHOL/HDL Ratio: 3.8 Ratio
Triglycerides: 132 mg/dL (ref <150)
VLDL: 26 mg/dL (ref 0–40)

## 2014-03-25 MED ORDER — ALBUTEROL SULFATE HFA 108 (90 BASE) MCG/ACT IN AERS: 2.0000 | INHALATION_SPRAY | Freq: Four times a day (QID) | RESPIRATORY_TRACT | Status: DC | PRN

## 2014-03-25 NOTE — Patient Instructions (Signed)
Allergies Allergies may happen from anything your body is sensitive to. This may be food, medicines, pollens, chemicals, and nearly anything around you in everyday life that produces allergens. An allergen is anything that causes an allergy producing substance. Heredity is often a factor in causing these problems. This means you may have some of the same allergies as your parents. Food allergies happen in all age groups. Food allergies are some of the most severe and life threatening. Some common food allergies are cow's milk, seafood, eggs, nuts, wheat, and soybeans. SYMPTOMS   Swelling around the mouth.  An itchy red rash or hives.  Vomiting or diarrhea.  Difficulty breathing. SEVERE ALLERGIC REACTIONS ARE LIFE-THREATENING. This reaction is called anaphylaxis. It can cause the mouth and throat to swell and cause difficulty with breathing and swallowing. In severe reactions only a trace amount of food (for example, peanut oil in a salad) may cause death within seconds. Seasonal allergies occur in all age groups. These are seasonal because they usually occur during the same season every year. They may be a reaction to molds, grass pollens, or tree pollens. Other causes of problems are house dust mite allergens, pet dander, and mold spores. The symptoms often consist of nasal congestion, a runny itchy nose associated with sneezing, and tearing itchy eyes. There is often an associated itching of the mouth and ears. The problems happen when you come in contact with pollens and other allergens. Allergens are the particles in the air that the body reacts to with an allergic reaction. This causes you to release allergic antibodies. Through a chain of events, these eventually cause you to release histamine into the blood stream. Although it is meant to be protective to the body, it is this release that causes your discomfort. This is why you were given anti-histamines to feel better. If you are unable to  pinpoint the offending allergen, it may be determined by skin or blood testing. Allergies cannot be cured but can be controlled with medicine. Hay fever is a collection of all or some of the seasonal allergy problems. It may often be treated with simple over-the-counter medicine such as diphenhydramine. Take medicine as directed. Do not drink alcohol or drive while taking this medicine. Check with your caregiver or package insert for child dosages. If these medicines are not effective, there are many new medicines your caregiver can prescribe. Stronger medicine such as nasal spray, eye drops, and corticosteroids may be used if the first things you try do not work well. Other treatments such as immunotherapy or desensitizing injections can be used if all else fails. Follow up with your caregiver if problems continue. These seasonal allergies are usually not life threatening. They are generally more of a nuisance that can often be handled using medicine. HOME CARE INSTRUCTIONS   If unsure what causes a reaction, keep a diary of foods eaten and symptoms that follow. Avoid foods that cause reactions.  If hives or rash are present:  Take medicine as directed.  You may use an over-the-counter antihistamine (diphenhydramine) for hives and itching as needed.  Apply cold compresses (cloths) to the skin or take baths in cool water. Avoid hot baths or showers. Heat will make a rash and itching worse.  If you are severely allergic:  Following a treatment for a severe reaction, hospitalization is often required for closer follow-up.  Wear a medic-alert bracelet or necklace stating the allergy.  You and your family must learn how to give adrenaline or use   an anaphylaxis kit.  If you have had a severe reaction, always carry your anaphylaxis kit or EpiPen with you. Use this medicine as directed by your caregiver if a severe reaction is occurring. Failure to do so could have a fatal outcome. SEEK MEDICAL  CARE IF:  You suspect a food allergy. Symptoms generally happen within 30 minutes of eating a food.  Your symptoms have not gone away within 2 days or are getting worse.  You develop new symptoms.  You want to retest yourself or your child with a food or drink you think causes an allergic reaction. Never do this if an anaphylactic reaction to that food or drink has happened before. Only do this under the care of a caregiver. SEEK IMMEDIATE MEDICAL CARE IF:   You have difficulty breathing, are wheezing, or have a tight feeling in your chest or throat.  You have a swollen mouth, or you have hives, swelling, or itching all over your body.  You have had a severe reaction that has responded to your anaphylaxis kit or an EpiPen. These reactions may return when the medicine has worn off. These reactions should be considered life threatening. MAKE SURE YOU:   Understand these instructions.  Will watch your condition.  Will get help right away if you are not doing well or get worse. Document Released: 10/05/2002 Document Revised: 11/06/2012 Document Reviewed: 03/11/2008 [facility] Patient Information 2015 [place], [place]. This information is not intended to replace advice given to you by your health care provider. Make sure you discuss any questions you have with your health care provider. Health Maintenance A healthy lifestyle and preventative care can promote health and wellness.  Maintain regular health, dental, and eye exams.  Eat a healthy diet. Foods like vegetables, fruits, whole grains, low-fat dairy products, and lean protein foods contain the nutrients you need and are low in calories. Decrease your intake of foods high in solid fats, added sugars, and salt. Get information about a proper diet from your health care provider, if necessary.  Regular physical exercise is one of the most important things you can do for your health. Most adults should get at least 150 minutes of  moderate-intensity exercise (any activity that increases your heart rate and causes you to sweat) each week. In addition, most adults need muscle-strengthening exercises on 2 or more days a week.   Maintain a healthy weight. The body mass index (BMI) is a screening tool to identify possible weight problems. It provides an estimate of body fat based on height and weight. Your health care provider can find your BMI and can help you achieve or maintain a healthy weight. For males 20 years and older:  A BMI below 18.5 is considered underweight.  A BMI of 18.5 to 24.9 is normal.  A BMI of 25 to 29.9 is considered overweight.  A BMI of 30 and above is considered obese.  Maintain normal blood lipids and cholesterol by exercising and minimizing your intake of saturated fat. Eat a balanced diet with plenty of fruits and vegetables. Blood tests for lipids and cholesterol should begin at age 67 and be repeated every 5 years. If your lipid or cholesterol levels are high, you are over age 44, or you are at high risk for heart disease, you may need your cholesterol levels checked more frequently.Ongoing high lipid and cholesterol levels should be treated with medicines if diet and exercise are not working.  If you smoke, find out from your health care provider  how to quit. If you do not use tobacco, do not start.  Lung cancer screening is recommended for adults aged 7-80 years who are at high risk for developing lung cancer because of a history of smoking. A yearly low-dose CT scan of the lungs is recommended for people who have at least a 30-pack-year history of smoking and are current smokers or have quit within the past 15 years. A pack year of smoking is smoking an average of 1 pack of cigarettes a day for 1 year (for example, a 30-pack-year history of smoking could mean smoking 1 pack a day for 30 years or 2 packs a day for 15 years). Yearly screening should continue until the smoker has stopped smoking  for at least 15 years. Yearly screening should be stopped for people who develop a health problem that would prevent them from having lung cancer treatment.  If you choose to drink alcohol, do not have more than 2 drinks per day. One drink is considered to be 12 oz (360 mL) of beer, 5 oz (150 mL) of wine, or 1.5 oz (45 mL) of liquor.  Avoid the use of street drugs. Do not share needles with anyone. Ask for help if you need support or instructions about stopping the use of drugs.  High blood pressure causes heart disease and increases the risk of stroke. Blood pressure should be checked at least every 1-2 years. Ongoing high blood pressure should be treated with medicines if weight loss and exercise are not effective.  If you are 54-81 years old, ask your health care provider if you should take aspirin to prevent heart disease.  Diabetes screening involves taking a blood sample to check your fasting blood sugar level. This should be done once every 3 years after age 24 if you are at a normal weight and without risk factors for diabetes. Testing should be considered at a younger age or be carried out more frequently if you are overweight and have at least 1 risk factor for diabetes.  Colorectal cancer can be detected and often prevented. Most routine colorectal cancer screening begins at the age of 62 and continues through age 68. However, your health care provider may recommend screening at an earlier age if you have risk factors for colon cancer. On a yearly basis, your health care provider may provide home test kits to check for hidden blood in the stool. A small camera at the end of a tube may be used to directly examine the colon (sigmoidoscopy or colonoscopy) to detect the earliest forms of colorectal cancer. Talk to your health care provider about this at age 96 when routine screening begins. A direct exam of the colon should be repeated every 5-10 years through age 59, unless early forms of  precancerous polyps or small growths are found.  People who are at an increased risk for hepatitis B should be screened for this virus. You are considered at high risk for hepatitis B if:  You were born in a country where hepatitis B occurs often. Talk with your health care provider about which countries are considered high risk.  Your parents were born in a high-risk country and you have not received a shot to protect against hepatitis B (hepatitis B vaccine).  You have HIV or AIDS.  You use needles to inject street drugs.  You live with, or have sex with, someone who has hepatitis B.  You are a man who has sex with other men (MSM).  You get hemodialysis treatment.  You take certain medicines for conditions like cancer, organ transplantation, and autoimmune conditions.  Hepatitis C blood testing is recommended for all people born from [birth year] through 1965 and any individual with known risk factors for hepatitis C.  Healthy men should no longer receive prostate-specific antigen (PSA) blood tests as part of routine cancer screening. Talk to your health care provider about prostate cancer screening.  Testicular cancer screening is not recommended for adolescents or adult males who have no symptoms. Screening includes self-exam, a health care provider exam, and other screening tests. Consult with your health care provider about any symptoms you have or any concerns you have about testicular cancer.  Practice safe sex. Use condoms and avoid high-risk sexual practices to reduce the spread of sexually transmitted infections (STIs).  You should be screened for STIs, including gonorrhea and chlamydia if:  You are sexually active and are younger than 24 years.  You are older than 24 years, and your health care provider tells you that you are at risk for this type of infection.  Your sexual activity has changed since you were last screened, and you are at an increased risk for chlamydia or  gonorrhea. Ask your health care provider if you are at risk.  If you are at risk of being infected with HIV, it is recommended that you take a prescription medicine daily to prevent HIV infection. This is called pre-exposure prophylaxis (PrEP). You are considered at risk if:  You are a man who has sex with other men (MSM).  You are a heterosexual man who is sexually active with multiple partners.  You take drugs by injection.  You are sexually active with a partner who has HIV.  Talk with your health care provider about whether you are at high risk of being infected with HIV. If you choose to begin PrEP, you should first be tested for HIV. You should then be tested every 3 months for as long as you are taking PrEP.  Use sunscreen. Apply sunscreen liberally and repeatedly throughout the day. You should seek shade when your shadow is shorter than you. Protect yourself by wearing long sleeves, pants, a wide-brimmed hat, and sunglasses year round whenever you are outdoors.  Tell your health care provider of new moles or changes in moles, especially if there is a change in shape or color. Also, tell your health care provider if a mole is larger than the size of a pencil eraser.  A one-time screening for abdominal aortic aneurysm (AAA) and surgical repair of large AAAs by ultrasound is recommended for men aged 28-75 years who are current or former smokers.  Stay current with your vaccines (immunizations). Document Released: 01/08/2008 Document Revised: 07/17/2013 Document Reviewed: 12/07/2010 [facility] Patient Information 2015 [place], [place]. This information is not intended to replace advice given to you by your health care provider. Make sure you discuss any questions you have with your health care provider.

## 2014-03-25 NOTE — Progress Notes (Addendum)
Subjective:  This chart was scribed for Robyn Haber, MD by Donato Schultz, Medical Scribe. This patient was seen in Room 26 and the patient's care was started at 9:00 AM.   Patient ID: Brad Price, male    DOB: 10-15-1970, 43 y.o.   MRN: [id number]  HPI HPI Comments: Brad Price is a 43 y.o. male who presents to the [facility] for an annual exam.  He saw the ENT physician a week ago where he had a hearing and pressure test performed.  He has an MRI of his sinuses scheduled for this Thursday.  He is no longer experiencing sinus pressure, pain, or dizziness.   On August 3 he came home to a new cat and when he took the cat the vet the next day, he had an allergic reaction.  His symptoms included rhinorrhea, itchy eyes, watery eyes, and chest tightness.  He was seen at [facility] after he had the reaction and has been taking Allegra since that visit.  He has not had any nasal congestion since then but will experience tinnitus, hearing loss from his left ear, and headache intermittently.  He does have an Epi-Pen.  Patient requests albuterol inhaler for prn use  He does not exercise regularly.  He works as a Corporate treasurer.  He has two sons- one starting 4th grade and another starting kindergarten.  His wife also works.  He does not smoke.    Past Medical History  Diagnosis Date   Allergy    Asthma    No past surgical history on file. Family History  Problem Relation Age of Onset   COPD Father    Heart disease Father    Stroke Father    Cancer Maternal Grandfather    Heart disease Paternal Grandfather    Stroke Paternal Grandmother    History   Social History   Marital Status: Married    Spouse Name: N/A    Number of Children: N/A   Years of Education: N/A   Occupational History   Not on file.   Social History Main Topics   Smoking status: Former Smoker   Smokeless tobacco: Former Systems developer   Alcohol Use: 1.0 - 1.5 oz/week    2-3 drink(s) per week    Drug Use: No   Sexual Activity: Yes   Other Topics Concern   Not on file   Social History Narrative   No narrative on file   No Known Allergies  Review of Systems  HENT: Positive for hearing loss and tinnitus. Negative for sinus pressure.   Respiratory: Positive for chest tightness.   Neurological: Positive for headaches. Negative for dizziness.     Objective:  Physical Exam  Nursing note and vitals reviewed. Constitutional: He is oriented to person, place, and time. He appears well-developed and well-nourished.  HENT:  Head: Normocephalic and atraumatic.  Right Ear: External ear normal.  Left Ear: External ear normal.  Nose: Nose normal.  Mouth/Throat: Oropharynx is clear and moist. No oropharyngeal exudate.  See note under skin about TM  Eyes: Conjunctivae and EOM are normal. Pupils are equal, round, and reactive to light.  Neck: Normal range of motion. Neck supple. No thyromegaly present.  Cardiovascular: Normal rate, regular rhythm and normal heart sounds.  Exam reveals no gallop and no friction rub.   No murmur heard. Pulmonary/Chest: Effort normal and breath sounds normal. No respiratory distress. He has no wheezes. He has no rales.  Abdominal: Soft. Bowel sounds are normal.  There is no tenderness.  Musculoskeletal: Normal range of motion.  Lymphadenopathy:    He has no cervical adenopathy.  Neurological: He is alert and oriented to person, place, and time.  Skin: Skin is warm and dry.  White scar on the inferior,posterior quadrant of his left TM  Psychiatric: He has a normal mood and affect. His behavior is normal.  EKG:  Incomplete RBB  BP 106/74   Pulse 82   Temp(Src) 98 F (36.7 C) (Oral)   Resp 16   Ht 5\' 11"  (1.803 m)   Wt 169 lb 3.2 oz (76.749 kg)   BMI 23.61 kg/m2   SpO2 96% Assessment & Plan:    I personally performed the services described in this documentation, which was scribed in my presence. The recorded information has been reviewed and is  accurate.  This has clearly been a tough month. The combination of a new cat and yellow jacket stings seems to have triggered the vertigo, tinnitus, balance problems, and deep facial headaches.  He is improving.  I have suggested he keep the new pet out of the bedroom.  Also the incomplete RBBB is new and cardiiology should evaluate given patient's strong F/H CAD  Routine general medical examination at a health care facility - Plan: POCT urinalysis dipstick, CBC with Differential, COMPLETE METABOLIC PANEL WITH GFR, Lipid panel  Family history of early CAD - Plan: POCT urinalysis dipstick, CBC with Differential, COMPLETE METABOLIC PANEL WITH GFR, Lipid panel, EKG 12-Lead  Signed, Robyn Haber, MD

## 2014-03-26 ENCOUNTER — Encounter: Payer: Self-pay | Admitting: Radiology

## 2014-03-28 ENCOUNTER — Ambulatory Visit
Admission: RE | Admit: 2014-03-28 | Discharge: 2014-03-28 | Disposition: A | Discharge status: Home / Self Care | Payer: 59 | Source: Ambulatory Visit | Attending: Otolaryngology | Admitting: Otolaryngology

## 2014-03-28 DIAGNOSIS — R42 Dizziness and giddiness: Secondary | ICD-10-CM

## 2014-03-28 DIAGNOSIS — H9319 Tinnitus, unspecified ear: Secondary | ICD-10-CM

## 2014-03-28 MED ORDER — GADOBENATE DIMEGLUMINE 529 MG/ML IV SOLN
15.0000 mL | Freq: Once | INTRAVENOUS | Status: AC | PRN
  Administered 2014-03-28: 15 mL via INTRAVENOUS

## 2014-04-03 ENCOUNTER — Telehealth: Payer: Self-pay

## 2014-04-03 NOTE — Telephone Encounter (Signed)
Please let patient know that MRI was normal.  If symptoms continue, I will refer to neurology

## 2014-04-03 NOTE — Telephone Encounter (Signed)
LMOM of below info.  

## 2014-04-03 NOTE — Telephone Encounter (Signed)
Pt called and VM on my VM asking if his MRI results are back from 03/28/14. Dr Carlean Jews, I'm not sure if you saw them yet or not. Can you please review and comment? Thank you.

## 2014-05-17 ENCOUNTER — Encounter: Payer: Self-pay | Admitting: Family Medicine

## 2014-06-04 ENCOUNTER — Telehealth: Payer: Self-pay | Admitting: Family Medicine

## 2014-06-05 NOTE — Telephone Encounter (Signed)
Patient states that he is leaving town tomorrow morning and needs his medication before then.

## 2014-06-06 NOTE — Telephone Encounter (Signed)
Lm to confirm pt was able to pick up medication sent to the pharmacy

## 2015-01-07 ENCOUNTER — Ambulatory Visit (INDEPENDENT_AMBULATORY_CARE_PROVIDER_SITE_OTHER): Payer: 59 | Admitting: Family Medicine

## 2015-01-07 VITALS — BP 110/78 | HR 85 | Temp 98.9°F | Resp 16 | Ht 71.0 in | Wt 171.0 lb

## 2015-01-07 DIAGNOSIS — Z91048 Other nonmedicinal substance allergy status: Secondary | ICD-10-CM | POA: Diagnosis not present

## 2015-01-07 DIAGNOSIS — H8109 Meniere's disease, unspecified ear: Secondary | ICD-10-CM

## 2015-01-07 DIAGNOSIS — J3489 Other specified disorders of nose and nasal sinuses: Secondary | ICD-10-CM

## 2015-01-07 DIAGNOSIS — J4530 Mild persistent asthma, uncomplicated: Secondary | ICD-10-CM

## 2015-01-07 DIAGNOSIS — J45909 Unspecified asthma, uncomplicated: Secondary | ICD-10-CM | POA: Insufficient documentation

## 2015-01-07 DIAGNOSIS — Z9109 Other allergy status, other than to drugs and biological substances: Secondary | ICD-10-CM | POA: Insufficient documentation

## 2015-01-07 MED ORDER — IPRATROPIUM BROMIDE 0.03 % NA SOLN: 2.0000 | Freq: Two times a day (BID) | NASAL | Status: DC

## 2015-01-07 MED ORDER — PREDNISONE 20 MG PO TABS: ORAL_TABLET | ORAL | Status: DC

## 2015-01-07 NOTE — Patient Instructions (Signed)
Take prednisone as directed. Continue with dulera, flonase and allegra. Use atrovent nasal spray twice a day along with flonase for 1 week. You may continue with sudafed but make sure to drink plenty of water with this as it will dry you out. Return if not getting better in 7-10 days.

## 2015-01-07 NOTE — Progress Notes (Signed)
[facility] [address], [place] [address] [phone]  Date:  01/07/2015   Name:  Brad Price   DOB:  1971/05/26   MRN:  [id number]  PCP:  No PCP Per Patient    Chief Complaint: Sinusitis and sinus drainage   History of Present Illness:  This is a 44 y.o. male with PMH env allergies, allergy induced asthma and meniere disease who is presenting with sinus pressure and drainage x 2 days. Reporting left sided facial pain and left sided upper teeth pain. No nasal discharge. No ear pain or hearing loss. Dx'd with meniere disease fall of 2015. Meniere disease flare triggered by sinus issues. He had extensive work up including brain MRI which was negative. He now takes Human resources officer and flonase daily. Dulera BID. He takes sudafed when having problems to prevent fluid build up in sinuses. He has not had a meniere disease flare since being diagnosed. He does not take anything for meniere disease, just follows a low sodium diet. He denies fever, chills, cough, sore throat, headache, vertigo.  Review of Systems:  Review of Systems See HPI  There are no active problems to display for this patient.   Prior to Admission medications   Medication Sig Start Date End Date Taking? Authorizing Provider  mometasone-formoterol (DULERA) 100-5 MCG/ACT AERO Inhale 2 puffs into the lungs 2 (two) times daily.    Yes Historical Provider, MD  fexofenadine (ALLEGRA) 180 MG tablet Take 180 mg by mouth daily.   Yes Historical Provider, MD  fluticasone (FLONASE) 50 MCG/ACT nasal spray Place 2 sprays into the nose daily.   Yes Historical Provider, MD         ibuprofen (ADVIL,MOTRIN) 200 MG tablet Take 400 mg by mouth every 6 (six) hours as needed for headache.   prn Historical Provider, MD  meclizine (ANTIVERT) 25 MG tablet Take 1 tablet (25 mg total) by mouth 3 (three) times daily as needed for dizziness. Patient not taking: Reported on 01/07/2015 03/04/14  prn Hyman Bible, PA-C          pseudoephedrine-acetaminophen (TYLENOL SINUS) 30-500 MG TABS Take 1 tablet by mouth every 4 (four) hours as needed (congestion).   prn Historical Provider, MD  VENTOLIN HFA 108 (90 BASE) MCG/ACT inhaler INHALE 2 PUFFS INTO THE LUNGS EVERY 6 HOURS AS NEEDED FOR WHEEZING OR SHORTNESS OF BREATH. Patient not taking: Reported on 01/07/2015 06/05/14  prn Robyn Haber, MD    No Known Allergies  History reviewed. No pertinent past surgical history.  History  Substance Use Topics  . Smoking status: Former Research scientist (life sciences)  . Smokeless tobacco: Former Systems developer  . Alcohol Use: 1.0 - 1.5 oz/week    2-3 drink(s) per week    Family History  Problem Relation Age of Onset  . COPD Father   . Heart disease Father   . Stroke Father   . Cancer Maternal Grandfather   . Heart disease Paternal Grandfather   . Stroke Paternal Grandmother     Medication list has been reviewed and updated.  Physical Examination:  Physical Exam  Constitutional: He is oriented to person, place, and time. He appears well-developed and well-nourished. No distress.  HENT:  Head: Normocephalic and atraumatic.  Right Ear: Hearing, external ear and ear canal normal.  Left Ear: Hearing, external ear and ear canal normal.  Nose: Mucosal edema present. Right sinus exhibits no maxillary sinus tenderness and no frontal sinus tenderness. Left sinus exhibits maxillary sinus tenderness. Left sinus exhibits no  frontal sinus tenderness.  Mouth/Throat: Uvula is midline and mucous membranes are normal. Normal dentition. Posterior oropharyngeal erythema present. No oropharyngeal exudate or posterior oropharyngeal edema.  Bilateral TMs retracted with scarring present No evidence tooth abscess, no tenderness along gum line   Eyes: Conjunctivae and lids are normal. Right eye exhibits no discharge. Left eye exhibits no discharge. No scleral icterus.  Cardiovascular: Normal rate, regular rhythm, normal heart sounds and normal pulses.   No murmur  heard. Pulmonary/Chest: Effort normal and breath sounds normal. No respiratory distress. He has no wheezes. He has no rhonchi. He has no rales.  Musculoskeletal: Normal range of motion.  Lymphadenopathy:    He has no cervical adenopathy.  Neurological: He is alert and oriented to person, place, and time.  Skin: Skin is warm, dry and intact. No lesion and no rash noted.  Psychiatric: He has a normal mood and affect. His speech is normal and behavior is normal. Thought content normal.   BP 110/78 mmHg  Pulse 85  Temp(Src) 98.9 F (37.2 C) (Oral)  Resp 16  Ht 5\' 11"  (1.803 m)  Wt 171 lb (77.565 kg)  BMI 23.86 kg/m2  SpO2 99%  Assessment and Plan:  1. Meniere disease, unspecified laterality 2. Sinus pressure 3. Env allergies 4. Allergy induced asthma Will treat with prednisone to prevent meniere disease flare. Atrovent added BID to daily flonase. Continue BID dulera and QD allegra. Take sudafed only as needed and increase hydration. Return if not getting better in 7-10 days. - predniSONE (DELTASONE) 20 MG tablet; Take 3 PO QAM x3days, 2 PO QAM x3days, 1 PO QAM x3days  Dispense: 18 tablet; Refill: 0 - ipratropium (ATROVENT) 0.03 % nasal spray; Place 2 sprays into both nostrils 2 (two) times daily.  Dispense: 30 mL; Refill: 0    Alisandra Son V. Drenda Freeze, MHS [facility]  01/07/2015

## 2015-02-01 ENCOUNTER — Ambulatory Visit (INDEPENDENT_AMBULATORY_CARE_PROVIDER_SITE_OTHER): Payer: Self-pay | Admitting: Family Medicine

## 2015-02-01 VITALS — BP 110/74 | HR 83 | Temp 98.4°F | Resp 18 | Ht 71.0 in | Wt 170.4 lb

## 2015-02-01 DIAGNOSIS — Z9109 Other allergy status, other than to drugs and biological substances: Secondary | ICD-10-CM

## 2015-02-01 DIAGNOSIS — Z91048 Other nonmedicinal substance allergy status: Secondary | ICD-10-CM

## 2015-02-01 DIAGNOSIS — Z0289 Encounter for other administrative examinations: Secondary | ICD-10-CM

## 2015-02-01 DIAGNOSIS — J4530 Mild persistent asthma, uncomplicated: Secondary | ICD-10-CM

## 2015-02-01 MED ORDER — ALBUTEROL SULFATE 108 (90 BASE) MCG/ACT IN AEPB: 2.0000 | INHALATION_SPRAY | RESPIRATORY_TRACT | Status: DC | PRN

## 2015-02-01 NOTE — Patient Instructions (Signed)
Health Maintenance A healthy lifestyle and preventative care can promote health and wellness.  Maintain regular health, dental, and eye exams.  Eat a healthy diet. Foods like vegetables, fruits, whole grains, low-fat dairy products, and lean protein foods contain the nutrients you need and are low in calories. Decrease your intake of foods high in solid fats, added sugars, and salt. Get information about a proper diet from your health care provider, if necessary.  Regular physical exercise is one of the most important things you can do for your health. Most adults should get at least 150 minutes of moderate-intensity exercise (any activity that increases your heart rate and causes you to sweat) each week. In addition, most adults need muscle-strengthening exercises on 2 or more days a week.   Maintain a healthy weight. The body mass index (BMI) is a screening tool to identify possible weight problems. It provides an estimate of body fat based on height and weight. Your health care provider can find your BMI and can help you achieve or maintain a healthy weight. For males 20 years and older:  A BMI below 18.5 is considered underweight.  A BMI of 18.5 to 24.9 is normal.  A BMI of 25 to 29.9 is considered overweight.  A BMI of 30 and above is considered obese.  Maintain normal blood lipids and cholesterol by exercising and minimizing your intake of saturated fat. Eat a balanced diet with plenty of fruits and vegetables. Blood tests for lipids and cholesterol should begin at age 20 and be repeated every 5 years. If your lipid or cholesterol levels are high, you are over age 50, or you are at high risk for heart disease, you may need your cholesterol levels checked more frequently.Ongoing high lipid and cholesterol levels should be treated with medicines if diet and exercise are not working.  If you smoke, find out from your health care provider how to quit. If you do not use tobacco, do not  start.  Lung cancer screening is recommended for adults aged 55-80 years who are at high risk for developing lung cancer because of a history of smoking. A yearly low-dose CT scan of the lungs is recommended for people who have at least a 30-pack-year history of smoking and are current smokers or have quit within the past 15 years. A pack year of smoking is smoking an average of 1 pack of cigarettes a day for 1 year (for example, a 30-pack-year history of smoking could mean smoking 1 pack a day for 30 years or 2 packs a day for 15 years). Yearly screening should continue until the smoker has stopped smoking for at least 15 years. Yearly screening should be stopped for people who develop a health problem that would prevent them from having lung cancer treatment.  If you choose to drink alcohol, do not have more than 2 drinks per day. One drink is considered to be 12 oz (360 mL) of beer, 5 oz (150 mL) of wine, or 1.5 oz (45 mL) of liquor.  Avoid the use of street drugs. Do not share needles with anyone. Ask for help if you need support or instructions about stopping the use of drugs.  High blood pressure causes heart disease and increases the risk of stroke. Blood pressure should be checked at least every 1-2 years. Ongoing high blood pressure should be treated with medicines if weight loss and exercise are not effective.  If you are 45-79 years old, ask your health care provider if   you should take aspirin to prevent heart disease.  Diabetes screening involves taking a blood sample to check your fasting blood sugar level. This should be done once every 3 years after age 45 if you are at a normal weight and without risk factors for diabetes. Testing should be considered at a younger age or be carried out more frequently if you are overweight and have at least 1 risk factor for diabetes.  Colorectal cancer can be detected and often prevented. Most routine colorectal cancer screening begins at the age of 50  and continues through age 75. However, your health care provider may recommend screening at an earlier age if you have risk factors for colon cancer. On a yearly basis, your health care provider may provide home test kits to check for hidden blood in the stool. A small camera at the end of a tube may be used to directly examine the colon (sigmoidoscopy or colonoscopy) to detect the earliest forms of colorectal cancer. Talk to your health care provider about this at age 50 when routine screening begins. A direct exam of the colon should be repeated every 5-10 years through age 75, unless early forms of precancerous polyps or small growths are found.  People who are at an increased risk for hepatitis B should be screened for this virus. You are considered at high risk for hepatitis B if:  You were born in a country where hepatitis B occurs often. Talk with your health care provider about which countries are considered high risk.  Your parents were born in a high-risk country and you have not received a shot to protect against hepatitis B (hepatitis B vaccine).  You have HIV or AIDS.  You use needles to inject street drugs.  You live with, or have sex with, someone who has hepatitis B.  You are a man who has sex with other men (MSM).  You get hemodialysis treatment.  You take certain medicines for conditions like cancer, organ transplantation, and autoimmune conditions.  Hepatitis C blood testing is recommended for all people born from 1945 through 1965 and any individual with known risk factors for hepatitis C.  Healthy men should no longer receive prostate-specific antigen (PSA) blood tests as part of routine cancer screening. Talk to your health care provider about prostate cancer screening.  Testicular cancer screening is not recommended for adolescents or adult males who have no symptoms. Screening includes self-exam, a health care provider exam, and other screening tests. Consult with your  health care provider about any symptoms you have or any concerns you have about testicular cancer.  Practice safe sex. Use condoms and avoid high-risk sexual practices to reduce the spread of sexually transmitted infections (STIs).  You should be screened for STIs, including gonorrhea and chlamydia if:  You are sexually active and are younger than 24 years.  You are older than 24 years, and your health care provider tells you that you are at risk for this type of infection.  Your sexual activity has changed since you were last screened, and you are at an increased risk for chlamydia or gonorrhea. Ask your health care provider if you are at risk.  If you are at risk of being infected with HIV, it is recommended that you take a prescription medicine daily to prevent HIV infection. This is called pre-exposure prophylaxis (PrEP). You are considered at risk if:  You are a man who has sex with other men (MSM).  You are a heterosexual man who   is sexually active with multiple partners.  You take drugs by injection.  You are sexually active with a partner who has HIV.  Talk with your health care provider about whether you are at high risk of being infected with HIV. If you choose to begin PrEP, you should first be tested for HIV. You should then be tested every 3 months for as long as you are taking PrEP.  Use sunscreen. Apply sunscreen liberally and repeatedly throughout the day. You should seek shade when your shadow is shorter than you. Protect yourself by wearing long sleeves, pants, a wide-brimmed hat, and sunglasses year round whenever you are outdoors.  Tell your health care provider of new moles or changes in moles, especially if there is a change in shape or color. Also, tell your health care provider if a mole is larger than the size of a pencil eraser.  A one-time screening for abdominal aortic aneurysm (AAA) and surgical repair of large AAAs by ultrasound is recommended for men aged  43-75 years who are current or former smokers.  Stay current with your vaccines (immunizations). Document Released: 01/08/2008 Document Revised: 07/17/2013 Document Reviewed: 12/07/2010 [facility] Patient Information 2015 [place], [place]. This information is not intended to replace advice given to you by your health care provider. Make sure you discuss any questions you have with your health care provider. Asthma Attack Prevention Although there is no way to prevent asthma from starting, you can take steps to control the disease and reduce its symptoms. Learn about your asthma and how to control it. Take an active role to control your asthma by working with your health care provider to create and follow an asthma action plan. An asthma action plan guides you in:  Taking your medicines properly.  Avoiding things that set off your asthma or make your asthma worse (asthma triggers).  Tracking your level of asthma control.  Responding to worsening asthma.  Seeking emergency care when needed. To track your asthma, keep records of your symptoms, check your peak flow number using a handheld device that shows how well air moves out of your lungs (peak flow meter), and get regular asthma checkups.  WHAT ARE SOME WAYS TO PREVENT AN ASTHMA ATTACK?  Take medicines as directed by your health care provider.  Keep track of your asthma symptoms and level of control.  With your health care provider, write a detailed plan for taking medicines and managing an asthma attack. Then be sure to follow your action plan. Asthma is an ongoing condition that needs regular monitoring and treatment.  Identify and avoid asthma triggers. Many outdoor allergens and irritants (such as pollen, mold, cold air, and air pollution) can trigger asthma attacks. Find out what your asthma triggers are and take steps to avoid them.  Monitor your breathing. Learn to recognize warning signs of an attack, such as coughing, wheezing, or  shortness of breath. Your lung function may decrease before you notice any signs or symptoms, so regularly measure and record your peak airflow with a home peak flow meter.  Identify and treat attacks early. If you act quickly, you are less likely to have a severe attack. You will also need less medicine to control your symptoms. When your peak flow measurements decrease and alert you to an upcoming attack, take your medicine as instructed and immediately stop any activity that may have triggered the attack. If your symptoms do not improve, get medical help.  Pay attention to increasing quick-relief inhaler use. If  you find yourself relying on your quick-relief inhaler, your asthma is not under control. See your health care provider about adjusting your treatment. WHAT CAN MAKE MY SYMPTOMS WORSE? A number of common things can set off or make your asthma symptoms worse and cause temporary increased inflammation of your airways. Keep track of your asthma symptoms for several weeks, detailing all the environmental and emotional factors that are linked with your asthma. When you have an asthma attack, go back to your asthma diary to see which factor, or combination of factors, might have contributed to it. Once you know what these factors are, you can take steps to control many of them. If you have allergies and asthma, it is important to take asthma prevention steps at home. Minimizing contact with the substance to which you are allergic will help prevent an asthma attack. Some triggers and ways to avoid these triggers are: Animal Dander:  Some people are allergic to the flakes of skin or dried saliva from animals with fur or feathers.   There is no such thing as a hypoallergenic dog or cat breed. All dogs or cats can cause allergies, even if they don't shed.  Keep these pets out of your home.  If you are not able to keep a pet outdoors, keep the pet out of your bedroom and other sleeping areas at all  times, and keep the door closed.  Remove carpets and furniture covered with cloth from your home. If that is not possible, keep the pet away from fabric-covered furniture and carpets. Dust Mites: Many people with asthma are allergic to dust mites. Dust mites are tiny bugs that are found in every home in mattresses, pillows, carpets, fabric-covered furniture, bedcovers, clothes, stuffed toys, and other fabric-covered items.   Cover your mattress in a special dust-proof cover.  Cover your pillow in a special dust-proof cover, or wash the pillow each week in hot water. Water must be hotter than 130 F (54.4 C) to kill dust mites. Cold or warm water used with detergent and bleach can also be effective.  Wash the sheets and blankets on your bed each week in hot water.  Try not to sleep or lie on cloth-covered cushions.  Call ahead when traveling and ask for a smoke-free hotel room. Bring your own bedding and pillows in case the hotel only supplies feather pillows and down comforters, which may contain dust mites and cause asthma symptoms.  Remove carpets from your bedroom and those laid on concrete, if you can.  Keep stuffed toys out of the bed, or wash the toys weekly in hot water or cooler water with detergent and bleach. Cockroaches: Many people with asthma are allergic to the droppings and remains of cockroaches.   Keep food and garbage in closed containers. Never leave food out.  Use poison baits, traps, powders, gels, or paste (for example, boric acid).  If a spray is used to kill cockroaches, stay out of the room until the odor goes away. Indoor Mold:  Fix leaky faucets, pipes, or other sources of water that have mold around them.  Clean floors and moldy surfaces with a fungicide or diluted bleach.  Avoid using humidifiers, vaporizers, or swamp coolers. These can spread molds through the air. Pollen and Outdoor Mold:  When pollen or mold spore counts are high, try to keep your  windows closed.  Stay indoors with windows closed from late morning to afternoon. Pollen and some mold spore counts are highest at that time.  Ask your health care provider whether you need to take anti-inflammatory medicine or increase your dose of the medicine before your allergy season starts. Other Irritants to Avoid:  Tobacco smoke is an irritant. If you smoke, ask your health care provider how you can quit. Ask family members to quit smoking, too. Do not allow smoking in your home or car.  If possible, do not use a wood-burning stove, kerosene heater, or fireplace. Minimize exposure to all sources of smoke, including incense, candles, fires, and fireworks.  Try to stay away from strong odors and sprays, such as perfume, talcum powder, hair spray, and paints.  Decrease humidity in your home and use an indoor air cleaning device. Reduce indoor humidity to below 60%. Dehumidifiers or central air conditioners can do this.  Decrease house dust exposure by changing furnace and air cooler filters frequently.  Try to have someone else vacuum for you once or twice a week. Stay out of rooms while they are being vacuumed and for a short while afterward.  If you vacuum, use a dust mask from a hardware store, a double-layered or microfilter vacuum cleaner bag, or a vacuum cleaner with a HEPA filter.  Sulfites in foods and beverages can be irritants. Do not drink beer or wine or eat dried fruit, processed potatoes, or shrimp if they cause asthma symptoms.  Cold air can trigger an asthma attack. Cover your nose and mouth with a scarf on cold or windy days.  Several health conditions can make asthma more difficult to manage, including a runny nose, sinus infections, reflux disease, psychological stress, and sleep apnea. Work with your health care provider to manage these conditions.  Avoid close contact with people who have a respiratory infection such as a cold or the flu, since your asthma  symptoms may get worse if you catch the infection. Wash your hands thoroughly after touching items that may have been handled by people with a respiratory infection.  Get a flu shot every year to protect against the flu virus, which often makes asthma worse for days or weeks. Also get a pneumonia shot if you have not previously had one. Unlike the flu shot, the pneumonia shot does not need to be given yearly. Medicines:  Talk to your health care provider about whether it is safe for you to take aspirin or non-steroidal anti-inflammatory medicines (NSAIDs). In a small number of people with asthma, aspirin and NSAIDs can cause asthma attacks. These medicines must be avoided by people who have known aspirin-sensitive asthma. It is important that people with aspirin-sensitive asthma read labels of all over-the-counter medicines used to treat pain, colds, coughs, and fever.  Beta-blockers and ACE inhibitors are other medicines you should discuss with your health care provider. HOW CAN I FIND OUT WHAT I AM ALLERGIC TO? Ask your asthma health care provider about allergy skin testing or blood testing (the RAST test) to identify the allergens to which you are sensitive. If you are found to have allergies, the most important thing to do is to try to avoid exposure to any allergens that you are sensitive to as much as possible. Other treatments for allergies, such as medicines and allergy shots (immunotherapy) are available.  CAN I EXERCISE? Follow your health care provider's advice regarding asthma treatment before exercising. It is important to maintain a regular exercise program, but vigorous exercise or exercise in cold, humid, or dry environments can cause asthma attacks, especially for those people who have exercise-induced asthma. Document Released: 06/30/2009  Document Revised: 07/17/2013 Document Reviewed: 01/17/2013 [facility] Patient Information 2015 [place], [place]. This information is not intended to  replace advice given to you by your health care provider. Make sure you discuss any questions you have with your health care provider.

## 2015-02-01 NOTE — Progress Notes (Signed)
Subjective:  This chart was scribed for [name], MD by [name], ED Scribe. This patient was seen in room 9 and the patient's care was started at 10:09 AM.   Patient ID: Brad Price, male    DOB: 16-May-1971, 44 y.o.   MRN: [id number]  HPI   Chief Complaint  Patient presents with  . Annual Exam    Camp Physical   HPI Comments: Brad Price is a 44 y.o. male who presents to the [facility] for a camp physical. Patient's last physical was 1 year ago in office . He had an abnormal EKG at that time with positive family hx of CAD. He was referred to Dr. Einar Gip who recommended patient to have a stress test which was normal. RBBB was thought normal variant. He was advised to follow a low salt diet and continue exercise. Lipid panel was normal, LDL 120, none HDL of 146. Normal CNP, CBC and UA. epipen for yellow jackets anaphylactic.  Pt is here for a physical for camp he is attending with his 1 y.o. son. He denies changes in the past year. He had a vertigo spell and sinus troubles last fall which he states has resolved. He is currently being treated for a respiratory infection. He takes Human resources officer at night which works best for him and flonase. He takes dulera BID for hx of asthma. He denies epipen being expired. He denies bone or joint pain within the past year.  Past Medical History  Diagnosis Date  . Allergy   . Asthma   . Meniere's disease of both ears 2015   No past surgical history on file. Prior to Admission medications   Medication Sig Start Date End Date Taking? Authorizing Provider  fexofenadine (ALLEGRA) 180 MG tablet Take 180 mg by mouth daily.   Yes Historical Provider, MD  fluticasone (FLONASE) 50 MCG/ACT nasal spray Place 2 sprays into the nose daily.   Yes Historical Provider, MD  ibuprofen (ADVIL,MOTRIN) 200 MG tablet Take 400 mg by mouth every 6 (six) hours as needed for headache.   Yes Historical Provider, MD  mometasone-formoterol (DULERA) 100-5 MCG/ACT AERO  Inhale 2 puffs into the lungs 2 (two) times daily.    Yes Historical Provider, MD  predniSONE (STERAPRED UNI-PAK 21 TAB) 10 MG (21) TBPK tablet Take 10 mg by mouth daily.   Yes Historical Provider, MD  VENTOLIN HFA 108 (90 BASE) MCG/ACT inhaler INHALE 2 PUFFS INTO THE LUNGS EVERY 6 HOURS AS NEEDED FOR WHEEZING OR SHORTNESS OF BREATH. 06/05/14  Yes Robyn Haber, MD  meclizine (ANTIVERT) 25 MG tablet Take 1 tablet (25 mg total) by mouth 3 (three) times daily as needed for dizziness. Patient not taking: Reported on 01/07/2015 03/04/14   Hyman Bible, PA-C   No Known Allergies Family History  Problem Relation Age of Onset  . COPD Father   . Heart disease Father   . Stroke Father   . Cancer Maternal Grandfather   . Heart disease Paternal Grandfather   . Stroke Paternal Grandmother    History   Social History  . Marital Status: Married    Spouse Name: N/A  . Number of Children: N/A  . Years of Education: N/A   Social History Main Topics  . Smoking status: Former Research scientist (life sciences)  . Smokeless tobacco: Former Systems developer  . Alcohol Use: 1.2 - 1.8 oz/week    2-3 Standard drinks or equivalent per week  . Drug Use: No  . Sexual Activity: Yes  Other Topics Concern  . None   Social History Narrative    Review of Systems  All other systems reviewed and are negative.   Objective:   Physical Exam  Constitutional: He is oriented to person, place, and time. He appears well-developed and well-nourished. No distress.  HENT:  Head: Normocephalic and atraumatic.  Nose: Mucosal edema present.  Mouth/Throat: Oropharynx is clear and moist.  Eyes: Conjunctivae and EOM are normal.  Neck: Neck supple. No thyromegaly present.  Cardiovascular: Normal rate, regular rhythm and normal heart sounds.   Pulmonary/Chest: Effort normal. He has wheezes ( forced expiration).  Abdominal: Soft. Bowel sounds are normal. He exhibits no distension. There is no hepatosplenomegaly. There is no tenderness.    Musculoskeletal: Normal range of motion. He exhibits no edema.  Lymphadenopathy:    He has no cervical adenopathy.  Neurological: He is alert and oriented to person, place, and time. He has normal reflexes.  Reflex Scores:      Patellar reflexes are 2+ on the right side and 2+ on the left side. Skin: Skin is warm and dry.  Psychiatric: He has a normal mood and affect. His behavior is normal.  Nursing note and vitals reviewed.  Filed Vitals:   02/01/15 0931  BP: 110/74  Pulse: 83  Temp: 98.4 F (36.9 C)  TempSrc: Oral  Resp: 18  Height: 5\' 11"  (1.803 m)  Weight: 170 lb 6 oz (77.282 kg)  SpO2: 97%   Assessment & Plan:   1. Physical exam for camp   2. Allergy-induced asthma, mild persistent, uncomplicated   3. Environmental allergies   Paperwork for [employer] trip completed and returned to pt.  Pt declines routine screening labs today - will do next yr.  Meds ordered this encounter  Medications  . predniSONE (STERAPRED UNI-PAK 21 TAB) 10 MG (21) TBPK tablet    Sig: Take 10 mg by mouth daily.  . Albuterol Sulfate (PROAIR RESPICLICK) 552 (90 BASE) MCG/ACT AEPB    Sig: Inhale 2 puffs into the lungs every 4 (four) hours as needed.    Dispense:  1 each    Refill:  11    I personally performed the services described in this documentation, which was scribed in my presence. The recorded information has been reviewed and considered, and addended by me as needed.  [name], MD MPH

## 2015-07-01 ENCOUNTER — Other Ambulatory Visit: Payer: Self-pay | Admitting: Allergy and Immunology

## 2015-12-25 ENCOUNTER — Telehealth: Payer: Self-pay

## 2015-12-25 MED ORDER — ALBUTEROL SULFATE 108 (90 BASE) MCG/ACT IN AEPB: 2.0000 | INHALATION_SPRAY | RESPIRATORY_TRACT | Status: DC | PRN

## 2015-12-25 NOTE — Telephone Encounter (Signed)
Patient last seen on 02/25/15-Kozlow. He was trying to get his Symbicort filled and it is cost over $300 for a 3 month supply. Patient made a follow up visit to come see Dr.K and try to switch it to something else. Pt is wondering if he can have a sample to last until his appt.   Please Advise

## 2015-12-25 NOTE — Telephone Encounter (Signed)
Informed patient will place samples of Symbicort up front to last until his visit. Will also send in one refill for his albuterol inhaler.

## 2016-01-13 ENCOUNTER — Encounter: Payer: Self-pay | Admitting: Allergy and Immunology

## 2016-01-13 ENCOUNTER — Ambulatory Visit (INDEPENDENT_AMBULATORY_CARE_PROVIDER_SITE_OTHER): Payer: 59 | Admitting: Allergy and Immunology

## 2016-01-13 VITALS — BP 114/78 | HR 68 | Resp 16 | Ht 70.55 in | Wt 171.6 lb

## 2016-01-13 DIAGNOSIS — H1010 Acute atopic conjunctivitis, unspecified eye: Secondary | ICD-10-CM

## 2016-01-13 DIAGNOSIS — J309 Allergic rhinitis, unspecified: Secondary | ICD-10-CM | POA: Diagnosis not present

## 2016-01-13 DIAGNOSIS — J4540 Moderate persistent asthma, uncomplicated: Secondary | ICD-10-CM

## 2016-01-13 MED ORDER — SYMBICORT 160-4.5 MCG/ACT IN AERO: INHALATION_SPRAY | RESPIRATORY_TRACT | Status: DC

## 2016-01-13 MED ORDER — ALBUTEROL SULFATE HFA 108 (90 BASE) MCG/ACT IN AERS: INHALATION_SPRAY | RESPIRATORY_TRACT | Status: DC

## 2016-01-13 MED ORDER — EPINEPHRINE 0.3 MG/0.3ML IJ SOAJ: INTRAMUSCULAR | Status: DC

## 2016-01-13 NOTE — Progress Notes (Signed)
Follow-up Note  Referring Provider: No ref. provider found Primary Provider: Robyn Haber, MD Date of Office Visit: 01/13/2016  Subjective:   Brad Price (DOB: 04/20/1971) is a 45 y.o. male who returns to the [facility] on 01/13/2016 in re-evaluation of the following:  HPI: Brad Price returns to this clinic in reevaluation of his asthma and allergic rhinoconjunctivitis and history of Hymenoptera venom hypersensitivity state. Is been 1 year since I've seen him in his clinic.  During the interval he has not had any significant exacerbation of his asthma requiring a systemic steroid. His requirement for short acting bronchodilator is less than twice a week. However, when he ran out of Symbicort over the course of the past 2 weeks and his insurance company would not refill this medication because of a coverage issue he had to use his bronchodilator almost daily. He can exercise for the most part without any difficulty. He did not obtain the flu vaccine this past fall and will not do so this fall.  His nose has been doing quite well and he has not required any type of antibiotic to treat an episode of sinusitis. He uses Flonase occasionally.  He makes his best attempt at staying away from specific Hymenoptera and he does have an EpiPen.    Medication List           Albuterol Sulfate 108 (90 Base) MCG/ACT Aepb  Commonly known as:  PROAIR RESPICLICK  Inhale 2 puffs into the lungs every 4 (four) hours as needed.     EPIPEN 2-PAK 0.3 mg/0.3 mL Soaj injection  Generic drug:  EPINEPHrine  Inject 0.3 mg into the muscle once. Use as directed for life-threatening allergic reaction.     fexofenadine 180 MG tablet  Commonly known as:  ALLEGRA  Take 180 mg by mouth daily.     fluticasone 50 MCG/ACT nasal spray  Commonly known as:  FLONASE  Place 2 sprays into the nose daily.     SYMBICORT 160-4.5 MCG/ACT inhaler  Generic drug:  budesonide-formoterol  Inhale 2 puffs by mouth  two times daily     TYLENOL PO  Take by mouth as needed.        Past Medical History  Diagnosis Date  . Allergy   . Asthma   . Meniere's disease of both ears 2015  . Hymenoptera allergy     Past Surgical History  Procedure Laterality Date  . Tympanostomy tube placement      No Known Allergies  Review of systems negative except as noted in HPI / PMHx or noted below:  Review of Systems  Constitutional: Negative.   HENT: Negative.   Eyes: Negative.   Respiratory: Negative.   Cardiovascular: Negative.   Gastrointestinal: Negative.   Genitourinary: Negative.   Musculoskeletal: Negative.   Skin: Negative.   Neurological: Negative.   Endo/Heme/Allergies: Negative.   Psychiatric/Behavioral: Negative.      Objective:   Filed Vitals:   01/13/16 1747  BP: 114/78  Pulse: 68  Resp: 16   Height: 5' 10.55" (179.2 cm)  Weight: 171 lb 9.6 oz (77.837 kg)   Physical Exam  Constitutional: He is well-developed, well-nourished, and in no distress.  HENT:  Head: Normocephalic.  Right Ear: Tympanic membrane, external ear and ear canal normal.  Left Ear: Tympanic membrane, external ear and ear canal normal.  Nose: Nose normal. No mucosal edema or rhinorrhea.  Mouth/Throat: Uvula is midline, oropharynx is clear and moist and mucous membranes are normal. No  oropharyngeal exudate.  Eyes: Conjunctivae are normal.  Neck: Trachea normal. No tracheal tenderness present. No tracheal deviation present. No thyromegaly present.  Cardiovascular: Normal rate, regular rhythm, S1 normal, S2 normal and normal heart sounds.   No murmur heard. Pulmonary/Chest: Breath sounds normal. No stridor. No respiratory distress. He has no wheezes. He has no rales.  Musculoskeletal: He exhibits no edema.  Lymphadenopathy:       Head (right side): No tonsillar adenopathy present.       Head (left side): No tonsillar adenopathy present.    He has no cervical adenopathy.  Neurological: He is alert. Gait  normal.  Skin: No rash noted. He is not diaphoretic. No erythema. Nails show no clubbing.  Psychiatric: Mood and affect normal.    Diagnostics:    Spirometry was performed and demonstrated an FEV1 of 2.56 at 62 % of predicted.  The patient had an Asthma Control Test with the following results: ACT Total Score: 20.    Assessment and Plan:   1. Asthma, moderate persistent, well-controlled   2. Allergic rhinoconjunctivitis     1. Symbicort 160 2 inhalations twice a day. 3 month written prescription  2. Ventolin HFA 2 puffs every 4-6 hours. [facility]  3. Flonase 1-2 sprays each nostril one time per day. [facility] EpiPen. [facility]  4. Consider fall flu vaccine  5. Return to clinic in 1 year or earlier if problem  Brad Price is doing relatively well at this point in time and we'll continue to have him use the medical therapy mentioned above. I've given him a coupon to Help pay with his co-pay for Symbicort. Apparently if this card works it will provide him with a 0 co-pay requirements. I had a long talk with him today about the need to obtain the flu vaccine this coming fall season but I do not think that this is going to happen as he has not received a flu vaccine in greater than a decade. He has a very good understanding of his asthma condition and when it is appropriate to seek out further care if he develops a problem.He is certainly welcome to return to this clinic should he develop a significant problem with the therapy mentioned above. Otherwise I'll just see him back in this clinic in 1 year.  [name], MD [place]

## 2016-01-13 NOTE — Patient Instructions (Addendum)
  1. Symbicort 160 2 inhalations twice a day. 3 month written prescription  2. Ventolin HFA 2 puffs every 4-6 hours. [facility]  3. Flonase 1-2 sprays each nostril one time per day. [facility] EpiPen. [facility]  4. Consider fall flu vaccine  5. Return to clinic in 1 year or earlier if problem

## 2016-04-03 ENCOUNTER — Ambulatory Visit (INDEPENDENT_AMBULATORY_CARE_PROVIDER_SITE_OTHER): Payer: 59 | Admitting: Family Medicine

## 2016-04-03 ENCOUNTER — Encounter: Payer: Self-pay | Admitting: Family Medicine

## 2016-04-03 VITALS — BP 116/80 | HR 72 | Temp 98.4°F | Resp 16 | Ht 71.0 in | Wt 178.2 lb

## 2016-04-03 DIAGNOSIS — H109 Unspecified conjunctivitis: Secondary | ICD-10-CM

## 2016-04-03 MED ORDER — OFLOXACIN 0.3 % OP SOLN: OPHTHALMIC | 0 refills | Status: DC

## 2016-04-03 NOTE — Progress Notes (Signed)
Subjective:  By signing my name below, I, [name], attest that this documentation has been prepared under the direction and in the presence of [name], MD.  Electronically Signed: [name], ED Scribe. 04/03/2016. 9:33 AM.   Patient ID: Brad Price, male    DOB: 1970/08/16, 45 y.o.   MRN: [id number]  HPI Chief Complaint  Patient presents with  . Conjunctivitis    HPI Comments: Brad Price is a 45 y.o. male who presents to the [facility] complaining of left eye redness. Pt states he woke up yesterday morning with difficulty opening left eye.  He reports some blurry vision, left eye itchiness yesterday and some yellow drainage.  He has been using left over, expired abx eye drops prescribed previously for his child. No sick contacts at home. He does not wear glasses or contact. He denies fever, chills, cough, sore throat, ear pain.   Patient Active Problem List   Diagnosis Date Noted  . 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 Environmental allergies 01/07/2015  . Allergy-induced asthma 01/07/2015   Past Medical History:  Diagnosis Date  . Allergy   . Asthma   . Hymenoptera allergy   . Meniere's disease of both ears 2015   Past Surgical History:  Procedure Laterality Date  . TYMPANOSTOMY TUBE PLACEMENT     No Known Allergies Prior to Admission medications   Medication Sig Start Date End Date Taking? Authorizing Provider  Acetaminophen (TYLENOL PO) Take by mouth as needed.   Yes Historical Provider, MD  albuterol (VENTOLIN HFA) 108 (90 Base) MCG/ACT inhaler Inhale two puffs every four to six hours as needed for cough or wheeze. 01/13/16  Yes Jiles Prows, MD  EPINEPHrine (EPIPEN 2-PAK) 0.3 mg/0.3 mL IJ SOAJ injection Use as directed for life-threatening allergic reaction. 01/13/16  Yes Jiles Prows, MD  fexofenadine (ALLEGRA) 180 MG tablet Take 180 mg by mouth daily.   Yes Historical Provider, MD  SYMBICORT 160-4.5 MCG/ACT inhaler Inhale two puffs twice daily to  prevent cough or wheeze. Rinse, gargle, and spit after use. 01/13/16  Yes Jiles Prows, MD  trimethoprim-polymyxin b (POLYTRIM) ophthalmic solution every 4 (four) hours.   Yes Historical Provider, MD   Social History   Social History  . Marital status: Married    Spouse name: N/A  . Number of children: N/A  . Years of education: N/A   Occupational History  . Not on file.   Social History Main Topics  . Smoking status: Former Smoker    Quit date: 01/13/1996  . Smokeless tobacco: Former Systems developer  . Alcohol use 1.2 - 1.8 oz/week    2 - 3 Standard drinks or equivalent per week  . Drug use: No  . Sexual activity: Yes   Other Topics Concern  . Not on file   Social History Narrative  . No narrative on file   Review of Systems  Constitutional: Negative for chills and fever.  HENT: Negative for congestion, ear pain, rhinorrhea and sore throat.   Eyes: Positive for pain, discharge, redness, itching and visual disturbance. Negative for photophobia.       Objective:   Physical Exam  Constitutional: He is oriented to person, place, and time. He appears well-developed and well-nourished. No distress.  HENT:  Head: Normocephalic and atraumatic.  Eyes: EOM are normal. Left eye exhibits no chemosis. Left conjunctiva is injected.  Diffuse injection left conjunctiva, worse on medial and lower aspect. No significant chemosis. Mild amount of edema  on lower lids, none upper. Mild purulent drainage noted on the left medial aspect. Funduscopic exam benign.   Neck: Neck supple.  Cardiovascular: Normal rate.   Musculoskeletal: Normal range of motion.  Neurological: He is alert and oriented to person, place, and time.  Skin: Skin is warm and dry.  Psychiatric: He has a normal mood and affect. His behavior is normal.  Nursing note and vitals reviewed.  Vitals:   04/03/16 0929  BP: 116/80  Pulse: 72  Resp: 16  Temp: 98.4 F (36.9 C)  TempSrc: Oral  SpO2: 98%  Weight: 178 lb 3.2 oz (80.8 kg)   Height: 5\' 11"  (1.803 m)    Assessment & Plan:   1. Conjunctivitis of left eye     Meds ordered this encounter  Medications  . trimethoprim-polymyxin b (POLYTRIM) ophthalmic solution    Sig: every 4 (four) hours.  Marland Kitchen ofloxacin (OCUFLOX) 0.3 % ophthalmic solution    Sig: 1 drop in left eye every 2 hours today while awake, 2 drops in left eye every 4 hours while awake tomorrow, then 2 drops qid x 5d    Dispense:  5 mL    Refill:  0    I personally performed the services described in this documentation, which was scribed in my presence. The recorded information has been reviewed and considered, and addended by me as needed.   [name], M.D.  [facility] [address] [place], [place] [address] [phone] phone [phone] fax  04/06/16 11:05 PM

## 2016-04-03 NOTE — Patient Instructions (Addendum)
IF you received an x-ray today, you will receive an invoice from [facility]. Please contact [facility] at [phone] with questions or concerns regarding your invoice.   IF you received labwork today, you will receive an invoice from [employer]. Please contact [facility] at [phone] with questions or concerns regarding your invoice.   Our billing staff will not be able to assist you with questions regarding bills from these companies.  You will be contacted with the lab results as soon as they are available. The fastest way to get your results is to activate your My Chart account. Instructions are located on the last page of this paperwork. If you have not heard from [place] regarding the results in 2 weeks, please contact this office.     Bacterial Conjunctivitis Bacterial conjunctivitis, commonly called pink eye, is an inflammation of the clear membrane that covers the white part of the eye (conjunctiva). The inflammation can also happen on the underside of the eyelids. The blood vessels in the conjunctiva become inflamed, causing the eye to become red or pink. Bacterial conjunctivitis may spread easily from one eye to another and from person to person (contagious).  CAUSES  Bacterial conjunctivitis is caused by bacteria. The bacteria may come from your own skin, your upper respiratory tract, or from someone else with bacterial conjunctivitis. SYMPTOMS  The normally white color of the eye or the underside of the eyelid is usually pink or red. The pink eye is usually associated with irritation, tearing, and some sensitivity to light. Bacterial conjunctivitis is often associated with a thick, yellowish discharge from the eye. The discharge may turn into a crust on the eyelids overnight, which causes your eyelids to stick together. If a discharge is present, there may also be some blurred vision in the affected eye. DIAGNOSIS  Bacterial  conjunctivitis is diagnosed by your caregiver through an eye exam and the symptoms that you report. Your caregiver looks for changes in the surface tissues of your eyes, which may point to the specific type of conjunctivitis. A sample of any discharge may be collected on a cotton-tip swab if you have a severe case of conjunctivitis, if your cornea is affected, or if you keep getting repeat infections that do not respond to treatment. The sample will be sent to a lab to see if the inflammation is caused by a bacterial infection and to see if the infection will respond to antibiotic medicines. TREATMENT   Bacterial conjunctivitis is treated with antibiotics. Antibiotic eyedrops are most often used. However, antibiotic ointments are also available. Antibiotics pills are sometimes used. Artificial tears or eye washes may ease discomfort. HOME CARE INSTRUCTIONS   To ease discomfort, apply a cool, clean washcloth to your eye for 10-20 minutes, 3-4 times a day.  Gently wipe away any drainage from your eye with a warm, wet washcloth or a cotton ball.  Wash your hands often with soap and water. Use paper towels to dry your hands.  Do not share towels or washcloths. This may spread the infection.  Change or wash your pillowcase every day.  You should not use eye makeup until the infection is gone.  Do not operate machinery or drive if your vision is blurred.  Stop using contact lenses. Ask your caregiver how to sterilize or replace your contacts before using them again. This depends on the type of contact lenses that you use.  When applying medicine to the infected eye, do not touch the  edge of your eyelid with the eyedrop bottle or ointment tube. SEEK IMMEDIATE MEDICAL CARE IF:   Your infection has not improved within 3 days after beginning treatment.  You had yellow discharge from your eye and it returns.  You have increased eye pain.  Your eye redness is spreading.  Your vision becomes  blurred.  You have a fever or persistent symptoms for more than 2-3 days.  You have a fever and your symptoms suddenly get worse.  You have facial pain, redness, or swelling. MAKE SURE YOU:   Understand these instructions.  Will watch your condition.  Will get help right away if you are not doing well or get worse.   This information is not intended to replace advice given to you by your health care provider. Make sure you discuss any questions you have with your health care provider.   Document Released: 07/12/2005 Document Revised: 08/02/2014 Document Reviewed: 12/13/2011 Elsevier Interactive Patient Education [employer].

## 2016-04-26 ENCOUNTER — Other Ambulatory Visit: Payer: Self-pay | Admitting: Allergy and Immunology

## 2016-04-26 ENCOUNTER — Telehealth: Payer: Self-pay | Admitting: Allergy and Immunology

## 2016-04-26 ENCOUNTER — Other Ambulatory Visit: Payer: Self-pay

## 2016-04-26 MED ORDER — ALBUTEROL SULFATE HFA 108 (90 BASE) MCG/ACT IN AERS: INHALATION_SPRAY | RESPIRATORY_TRACT | 1 refills | Status: DC

## 2016-04-26 NOTE — Telephone Encounter (Signed)
Sent in refill

## 2016-04-26 NOTE — Telephone Encounter (Signed)
Pt called and needs ventolin HFA to [facility] on [address] [phone].

## 2016-11-01 ENCOUNTER — Telehealth: Payer: Self-pay | Admitting: Allergy and Immunology

## 2016-11-01 NOTE — Telephone Encounter (Signed)
Samples will be placed up front for pick up.

## 2016-11-01 NOTE — Telephone Encounter (Signed)
PT CALLED AND SAID HE WAS HAVING PROBLEM WITH HIS INS TO PAY FOR SYMBICORT AND WANTED TO SEE IF WE HAD ANY SAMPLES THAT WE COULD GIVE HIM SYMBICORT [phone]

## 2016-11-01 NOTE — Telephone Encounter (Signed)
Patient advised.

## 2016-12-04 ENCOUNTER — Encounter (HOSPITAL_COMMUNITY): Payer: Self-pay | Admitting: Emergency Medicine

## 2016-12-04 ENCOUNTER — Emergency Department (HOSPITAL_COMMUNITY): Payer: 59

## 2016-12-04 ENCOUNTER — Emergency Department (HOSPITAL_COMMUNITY)
Admission: EM | Admit: 2016-12-04 | Discharge: 2016-12-04 | Disposition: A | Discharge status: Home / Self Care | Payer: 59 | Attending: Emergency Medicine | Admitting: Emergency Medicine

## 2016-12-04 DIAGNOSIS — J45909 Unspecified asthma, uncomplicated: Secondary | ICD-10-CM | POA: Insufficient documentation

## 2016-12-04 DIAGNOSIS — M109 Gout, unspecified: Secondary | ICD-10-CM | POA: Diagnosis not present

## 2016-12-04 DIAGNOSIS — Z87891 Personal history of nicotine dependence: Secondary | ICD-10-CM | POA: Diagnosis not present

## 2016-12-04 DIAGNOSIS — Z79899 Other long term (current) drug therapy: Secondary | ICD-10-CM | POA: Diagnosis not present

## 2016-12-04 DIAGNOSIS — M79671 Pain in right foot: Secondary | ICD-10-CM | POA: Diagnosis present

## 2016-12-04 LAB — URIC ACID: Uric Acid, Serum: 6.1 mg/dL (ref 4.4–7.6)

## 2016-12-04 MED ORDER — OXYCODONE-ACETAMINOPHEN 5-325 MG PO TABS: 2.0000 | ORAL_TABLET | ORAL | 0 refills | Status: DC | PRN

## 2016-12-04 MED ORDER — NAPROXEN 500 MG PO TABS: 500.0000 mg | ORAL_TABLET | Freq: Two times a day (BID) | ORAL | 0 refills | Status: DC

## 2016-12-04 NOTE — ED Provider Notes (Signed)
[facility] DEPT Provider Note   CSN: [id number] Arrival date & time: 12/04/16  1337   By signing my name below, I, Brad Price, attest that this documentation has been prepared under the direction and in the presence of Brad Sink, PA-C Electronically Signed: [place], ED Scribe. 12/04/16. 2:18 PM.  History   Chief Complaint Chief Complaint  Patient presents with  . Foot Pain    HPI Brad Price is a 46 y.o. male who presents to the Emergency Department complaining of right foot pain onset 3 days ago. He notes that he initially had pain only to his right great toe and it has since progressed to his right foot. Pt reports associated redness to right great toe. Pt has tried ice and ibuprofen with mild relief of his symptoms. He notes that he does consume alcoholic beverages and had a beer last night. He reports that the day prior to onset of symptoms he was eating crab cakes ans shrimp with beer.  Pt states that he recently started a new diet where he is cutting out fatty foods. Pt denies similar symptoms in the past. He denies fever, chills, wound, swelling, recent trauma, recent injury, and any other symptoms. 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    The history is provided by the patient. No language interpreter was used.    Past Medical History:  Diagnosis Date  . Allergy   . Asthma   . Hymenoptera allergy   . Meniere's disease of both ears 2015    Patient Active Problem List   Diagnosis Date Noted  . 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 Environmental allergies 01/07/2015  . Allergy-induced asthma 01/07/2015    Past Surgical History:  Procedure Laterality Date  . TYMPANOSTOMY TUBE PLACEMENT         Home Medications    Prior to Admission medications   Medication Sig Start Date End Date Taking? Authorizing Provider  Acetaminophen (TYLENOL PO) Take by mouth as needed.    [provider]  albuterol (VENTOLIN HFA) 108 (90 Base) MCG/ACT inhaler INHALE 2  PUFFS BY MOUTH EVERY 4 TO 6 HOURS AS NEEDED FOR COUGH OR WHEEZING 04/26/16   Kozlow, Donnamarie Poag, MD  EPINEPHrine (EPIPEN 2-PAK) 0.3 mg/0.3 mL IJ SOAJ injection Use as directed for life-threatening allergic reaction. 01/13/16   Kozlow, Donnamarie Poag, MD  fexofenadine (ALLEGRA) 180 MG tablet Take 180 mg by mouth daily.    [provider]  naproxen (NAPROSYN) 500 MG tablet Take 1 tablet (500 mg total) by mouth 2 (two) times daily. 12/04/16   Jhoan Schmieder, Dellis Filbert, PA-C  ofloxacin (OCUFLOX) 0.3 % ophthalmic solution 1 drop in left eye every 2 hours today while awake, 2 drops in left eye every 4 hours while awake tomorrow, then 2 drops qid x 5d 04/03/16   Shawnee Knapp, MD  oxyCODONE-acetaminophen (PERCOCET/ROXICET) 5-325 MG tablet Take 2 tablets by mouth every 4 (four) hours as needed for severe pain. 12/04/16   Demian Maisel, Dellis Filbert, PA-C  SYMBICORT 160-4.5 MCG/ACT inhaler Inhale two puffs twice daily to prevent cough or wheeze. Rinse, gargle, and spit after use. 01/13/16   Kozlow, Donnamarie Poag, MD  trimethoprim-polymyxin b (POLYTRIM) ophthalmic solution every 4 (four) hours.    [provider]    Family History Family History  Problem Relation Age of Onset  . COPD Father   . Heart disease Father   . Stroke Father   . Cancer Maternal Grandfather   . Heart disease Paternal Grandfather   .  Stroke Paternal Grandmother     Social History Social History  Substance Use Topics  . Smoking status: Former Smoker    Quit date: 01/13/1996  . Smokeless tobacco: Former Systems developer  . Alcohol use 1.2 - 1.8 oz/week    2 - 3 Standard drinks or equivalent per week     Allergies   Patient has no known allergies.   Review of Systems Review of Systems  Constitutional: Negative for chills and fever.  Musculoskeletal: Positive for arthralgias (right great toe and right foot). Negative for joint swelling.  Skin: Positive for color change (redness to right great toe). Negative for wound.  All other systems reviewed and are  negative.    Physical Exam Updated Vital Signs BP 112/72 (BP Location: Left Arm)   Pulse 75   Temp 98.2 F (36.8 C) (Oral)   Ht 6' (1.829 m)   Wt 170 lb (77.1 kg)   SpO2 96%   BMI 23.06 kg/m   Physical Exam  Constitutional: He is oriented to person, place, and time. He appears well-developed and well-nourished. No distress.  HENT:  Head: Normocephalic and atraumatic.  Eyes: EOM are normal.  Neck: Neck supple.  Cardiovascular: Normal rate.   Pulmonary/Chest: Effort normal. No respiratory distress.  Abdominal: He exhibits no distension.  Musculoskeletal: Normal range of motion.       Right foot: There is tenderness.  Right great toe at the distal IP with moderate amount of redness and TTP. Remainder of foot atraumatic, non-tender with no swelling or edema.   Neurological: He is alert and oriented to person, place, and time.  Skin: Skin is warm and dry.  Psychiatric: He has a normal mood and affect. His behavior is normal.  Nursing note and vitals reviewed.    ED Treatments / Results  DIAGNOSTIC STUDIES: Oxygen Saturation is 96% on RA, nl by my interpretation.    COORDINATION OF CARE: 2:17 PM Discussed treatment plan with pt at bedside which includes right foot xray and pt agreed to plan.  Labs Labs Reviewed  URIC ACID    Radiology Dg Foot Complete Right  Result Date: 12/04/2016 CLINICAL DATA:  Pain, swelling and redness involving the great toe. EXAM: RIGHT FOOT COMPLETE - 3+ VIEW COMPARISON:  None. FINDINGS: The joint spaces are maintained. No acute bony findings. No erosive changes. IMPRESSION: Normal right foot radiographs. Electronically Signed   By: Marijo Sanes M.D.   On: 12/04/2016 14:20    Procedures Procedures (including critical care time)  Medications Ordered in ED Medications - No data to display   Initial Impression / Assessment and Plan / ED Course  I have reviewed the triage vital signs and the nursing notes.  Pertinent labs & imaging  results that were available during my care of the patient were reviewed by me and considered in my medical decision making (see chart for details).      Labs: Uric acid  Imaging: DG foot complete  Consults:  Therapeutics:  Discharge Meds: Naproxen, Percocet  Assessment/Plan: 46 year old male presents today with toe pain.  His presentation is most consistent with gout.  Patient does not have a previous diagnosis of gout, uric acid levels will be drawn here.  I do not feel that arthrocentesis would benefit the patient at this point as he has minimal swelling.  Patient is afebrile nontoxic, lower suspicion for septic arthritis.  Patient will be started on anti-inflammatories as this has improved his symptoms earlier, but he was not consistently taking these.  His presentation has not worsened making septic arthritis less likely.  Patient will be started on naproxen, with Percocet as needed for severe pain.  Patient will follow up with podiatrist and primary care for ongoing management of this, he is given strict return precautions, he verbalized understanding and agreement to today's plan had no further questions or concerns.  Final Clinical Impressions(s) / ED Diagnoses   Final diagnoses:  Acute gout involving toe of right foot, unspecified cause    New Prescriptions New Prescriptions   NAPROXEN (NAPROSYN) 500 MG TABLET    Take 1 tablet (500 mg total) by mouth 2 (two) times daily.   OXYCODONE-ACETAMINOPHEN (PERCOCET/ROXICET) 5-325 MG TABLET    Take 2 tablets by mouth every 4 (four) hours as needed for severe pain.   I personally performed the services described in this documentation, which was scribed in my presence. The recorded information has been reviewed and is accurate.     Okey Regal, PA-C 12/04/16 1442    Isla Pence, MD 12/04/16 [phone]

## 2016-12-04 NOTE — ED Notes (Signed)
Pt from home with complaints of right foot pain that began on Wed. Pt states it began in the joint of his great toe. Pt states the pain spread to his entire foot. Pt denies trauma

## 2016-12-04 NOTE — Discharge Instructions (Signed)
Your presentation today is most consistent with gout.  We will treat this with anti-inflammatories including naproxen.  Please take 1 500 mg tablet twice a day, continue this therapy for several days after symptoms improved.  We have drawn uric acid levels which your primary care or podiatrist can follow-up with upon your reassessment.  Please schedule follow-up evaluation next week.  If symptoms worsen including redness, warmth, fever chills please return to the emergency room for repeat evaluation.  I have provided you a prescription for Percocet, this is a very strong pain medication and should only be used for severe pain.  Please do not drink or drive while using this medication.

## 2017-01-04 ENCOUNTER — Other Ambulatory Visit: Payer: Self-pay | Admitting: Allergy and Immunology

## 2017-01-19 ENCOUNTER — Ambulatory Visit (INDEPENDENT_AMBULATORY_CARE_PROVIDER_SITE_OTHER): Payer: Self-pay | Admitting: Physician Assistant

## 2017-01-19 ENCOUNTER — Encounter: Payer: Self-pay | Admitting: Physician Assistant

## 2017-01-19 DIAGNOSIS — Z91038 Other insect allergy status: Secondary | ICD-10-CM

## 2017-01-19 DIAGNOSIS — Z91030 Bee allergy status: Secondary | ICD-10-CM | POA: Insufficient documentation

## 2017-01-19 DIAGNOSIS — Z0289 Encounter for other administrative examinations: Secondary | ICD-10-CM

## 2017-01-19 MED ORDER — EPINEPHRINE 0.3 MG/0.3ML IJ SOAJ: INTRAMUSCULAR | 3 refills | Status: DC

## 2017-01-19 NOTE — Progress Notes (Signed)
   Brad Price  MRN: [id number] DOB: [birth year]  PCP: No primary care provider on file.  No chief complaint on file.   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  He is healthy with no problems.    Review of Systems  Constitutional: Negative.   HENT: Negative.   Eyes: Negative.   Respiratory: Negative.   Cardiovascular: Negative.   Gastrointestinal: Negative.   Endocrine: Negative.   Genitourinary: Negative.   Musculoskeletal: Negative.   Skin: Negative.   Allergic/Immunologic: Negative.   Neurological: Negative.   Hematological: Negative.   Psychiatric/Behavioral: Negative.     Patient Active Problem List   Diagnosis Date Noted  . 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 Environmental allergies 01/07/2015  . 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 Acetaminophen (TYLENOL PO) Take by mouth as needed.    Marland Kitchen albuterol (VENTOLIN HFA) 108 (90 Base) MCG/ACT inhaler INHALE 2 PUFFS BY MOUTH EVERY 4 TO 6 HOURS AS NEEDED FOR COUGH OR WHEEZING 18 g 1  . fexofenadine (ALLEGRA) 180 MG tablet Take 180 mg by mouth daily.    . SYMBICORT 160-4.5 MCG/ACT inhaler INHALE 2 PUFFS INTO THE LUNGS TWICE DAILY TO PREVENT COUGH OR WHEEZE. RINSE GARGLE AND SPIT AFTER USE 10.2 g 0   No current facility-administered medications on file prior to visit.     No Known Allergies  Pt patients past, family and social history were reviewed and updated.   Objective:  There were no vitals taken for this visit.  Physical Exam  Constitutional: He is oriented to person, place, and time and well-developed, well-nourished, and in no distress.  HENT:  Head: Normocephalic and atraumatic.  Right Ear: Hearing, tympanic membrane, external ear and ear canal normal.  Left Ear: Hearing, tympanic membrane, external ear and ear canal normal.  Nose:  Nose normal.  Mouth/Throat: Uvula is midline, oropharynx is clear and moist and mucous membranes are normal.  Eyes: Conjunctivae and EOM are normal. Pupils are equal, round, and reactive to light.  Neck: Trachea normal and normal range of motion. Neck supple. No thyroid mass and no thyromegaly present.  Cardiovascular: Normal rate, regular rhythm and normal heart sounds.   No murmur heard. Pulmonary/Chest: Effort normal and breath sounds normal.  Abdominal: Soft. Bowel sounds are normal.  Musculoskeletal: Normal range of motion.  Neurological: He is alert and oriented to person, place, and time. Gait normal.  Skin: Skin is warm and dry.  Psychiatric: Mood, memory, affect and judgment normal.    Assessment and Plan :  Physical exam for camp  Bee sting allergy - Plan: EPINEPHrine (EPIPEN 2-PAK) 0.3 mg/0.3 mL IJ SOAJ injection   Form filled out.  Windell Hummingbird PA-C  [facility] 01/19/2017 6:19 PM

## 2017-04-01 ENCOUNTER — Other Ambulatory Visit: Payer: Self-pay | Admitting: Allergy and Immunology

## 2017-04-01 NOTE — Telephone Encounter (Signed)
Courtesy refill given for 30 days. Pt needs an OV.

## 2017-04-01 NOTE — Telephone Encounter (Signed)
Pt called and needs his albuterol callen into [facility] because he is going out of town this weekend. [phone]

## 2017-05-09 ENCOUNTER — Other Ambulatory Visit: Payer: Self-pay | Admitting: Allergy and Immunology

## 2017-05-10 ENCOUNTER — Ambulatory Visit (INDEPENDENT_AMBULATORY_CARE_PROVIDER_SITE_OTHER): Payer: 59 | Admitting: Allergy and Immunology

## 2017-05-10 VITALS — BP 110/78 | HR 56 | Resp 17 | Ht 71.0 in | Wt 172.8 lb

## 2017-05-10 DIAGNOSIS — Z91030 Bee allergy status: Secondary | ICD-10-CM | POA: Diagnosis not present

## 2017-05-10 DIAGNOSIS — T63441D Toxic effect of venom of bees, accidental (unintentional), subsequent encounter: Secondary | ICD-10-CM

## 2017-05-10 DIAGNOSIS — J4540 Moderate persistent asthma, uncomplicated: Secondary | ICD-10-CM | POA: Diagnosis not present

## 2017-05-10 DIAGNOSIS — J3089 Other allergic rhinitis: Secondary | ICD-10-CM

## 2017-05-10 DIAGNOSIS — IMO0001 Reserved for inherently not codable concepts without codable children: Secondary | ICD-10-CM

## 2017-05-10 MED ORDER — BUDESONIDE-FORMOTEROL FUMARATE 160-4.5 MCG/ACT IN AERO: INHALATION_SPRAY | RESPIRATORY_TRACT | 3 refills | Status: DC

## 2017-05-10 MED ORDER — AUVI-Q 0.3 MG/0.3ML IJ SOAJ: INTRAMUSCULAR | 2 refills | Status: DC

## 2017-05-10 MED ORDER — ALBUTEROL SULFATE HFA 108 (90 BASE) MCG/ACT IN AERS: INHALATION_SPRAY | RESPIRATORY_TRACT | 2 refills | Status: DC

## 2017-05-10 NOTE — Patient Instructions (Addendum)
  1. Continue Symbicort 160 2 inhalations twice a day.    2. Continue Flonase 1-2 sprays each nostril one time per day.  3. Continue Ventolin HFA 2 puffs every 4-6 hours and EpiPen / AUVI-Q 0.3 and antihistamine if needed.       4. Consider fall flu vaccine  5. Return to clinic in 1 year or earlier if problem

## 2017-05-10 NOTE — Progress Notes (Signed)
Follow-up Note  Referring Provider: No ref. provider found Primary Provider: Robyn Haber, MD Date of Office Visit: 05/10/2017  Subjective:   Brad Price (DOB: 23-Jul-1971) is a 46 y.o. 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 His last visit to this clinic was June 2017.  His asthma has been under excellent control as long as he continues to use Symbicort consistently. He can exercise without any difficulty and his requirement for short acting bronchodilator is approximately 2 or 3 times per week without any nocturnal bronchospastic symptoms. He has not required a systemic steroid or an antibiotic to treat any type of respiratory tract problem.  He has had very little problems with his upper airways regarding atopic disease.  He remains away from Hymenoptera exposure. He does have a injectable epinephrine device.  Allergies as of 05/10/2017   No Known Allergies     Medication List      fexofenadine 180 MG tablet Commonly known as:  ALLEGRA Take 180 mg by mouth daily.   SYMBICORT 160-4.5 MCG/ACT inhaler Generic drug:  budesonide-formoterol INHALE 2 PUFFS INTO THE LUNGS TWICE DAILY TO PREVENT COUGH OR WHEEZE. RINSE GARGLE AND SPIT AFTER USE   TYLENOL PO Take by mouth as needed.   VENTOLIN HFA 108 (90 Base) MCG/ACT inhaler Generic drug:  albuterol INHALE 2 PUFFS BY MOUTH EVERY 4 TO 6 HOURS AS NEEDED FOR COUGH OR WHEEZING       Past Medical History:  Diagnosis Date  . Allergy   . Asthma   . Hymenoptera allergy   . Meniere's disease of both ears 2015    Past Surgical History:  Procedure Laterality Date  . TYMPANOSTOMY TUBE PLACEMENT      Review of systems negative except as noted in HPI / PMHx or noted below:  Review of  Systems  Constitutional: Negative.   HENT: Negative.   Eyes: Negative.   Respiratory: Negative.   Cardiovascular: Negative.   Gastrointestinal: Negative.   Genitourinary: Negative.   Musculoskeletal: Negative.   Skin: Negative.   Neurological: Negative.   Endo/Heme/Allergies: Negative.   Psychiatric/Behavioral: Negative.      Objective:   Vitals:   05/10/17 1502  BP: 110/78  Pulse: (!) 56  Resp: 17  SpO2: 97%   Height: 5\' 11"  (180.3 cm)  Weight: 172 lb 12.8 oz (78.4 kg)   Physical Exam  Constitutional: He is well-developed, well-nourished, and in no distress.  HENT:  Head: Normocephalic.  Right Ear: External ear and ear canal normal. Tympanic membrane is scarred.  Left Ear: External ear and ear canal normal. Tympanic membrane is scarred.  Nose: Nose normal. No mucosal edema or rhinorrhea.  Mouth/Throat: Uvula is midline, oropharynx is clear and moist and mucous membranes are normal. No oropharyngeal exudate.  Eyes: Conjunctivae are normal.  Neck: Trachea normal. No tracheal tenderness present. No tracheal deviation present. No thyromegaly present.  Cardiovascular: Normal rate, regular rhythm, S1 normal, S2 normal and normal heart sounds.   No murmur heard. Pulmonary/Chest: Breath sounds normal. No stridor. No respiratory distress. He has no wheezes. He has no rales.  Musculoskeletal: He exhibits no edema.  Lymphadenopathy:       Head (right side): No tonsillar adenopathy present.       Head (left side): No tonsillar adenopathy present.  He has no cervical adenopathy.  Neurological: He is alert. Gait normal.  Skin: No rash noted. He is not diaphoretic. No erythema. Nails show no clubbing.  Psychiatric: Mood and affect normal.    Diagnostics:    Spirometry was performed and demonstrated an FEV1 of 2.77 at 67 % of predicted.  The patient had an Asthma Control Test with the following results: ACT Total Score: 21.    Assessment and Plan:   1. Asthma, moderate  persistent, well-controlled   2. Other allergic rhinitis   3. Hymenoptera reaction, accidental or unintentional, subsequent encounter   4. Bee sting allergy     1. Continue Symbicort 160 2 inhalations twice a day.    2. Continue Flonase 1-2 sprays each nostril one time per day.  3. Continue Ventolin HFA 2 puffs every 4-6 hours and EpiPen / AUVI-Q 0.3 and antihistamine if needed.       4. Consider fall flu vaccine  5. Return to clinic in 1 year or earlier if problem  Greene appears to be doing very well at this point in time on his current plan which has worked well for him over the course of the past several years. He will continue to use anti-inflammatory agents for his respiratory tract and if needed injectable epinephrine for an adverse effect secondary to Hymenoptera exposure. I had a talk with him in the past about undergoing a course of immunotherapy for Hymenoptera and he is not interested in this form of therapy at this point. I will see him back in this clinic in 1 year or earlier if there is a problem.  [name], MD Allergy / Immunology [place]

## 2017-05-11 ENCOUNTER — Encounter: Payer: Self-pay | Admitting: Allergy and Immunology

## 2017-06-01 ENCOUNTER — Encounter: Payer: Self-pay | Admitting: Physician Assistant

## 2017-06-01 ENCOUNTER — Other Ambulatory Visit: Payer: Self-pay

## 2017-06-01 ENCOUNTER — Ambulatory Visit: Payer: 59 | Admitting: Physician Assistant

## 2017-06-01 VITALS — BP 118/78 | HR 82 | Temp 98.6°F | Resp 18 | Ht 72.32 in | Wt 172.0 lb

## 2017-06-01 DIAGNOSIS — R0981 Nasal congestion: Secondary | ICD-10-CM | POA: Diagnosis not present

## 2017-06-01 DIAGNOSIS — J069 Acute upper respiratory infection, unspecified: Secondary | ICD-10-CM

## 2017-06-01 DIAGNOSIS — J3489 Other specified disorders of nose and nasal sinuses: Secondary | ICD-10-CM | POA: Diagnosis not present

## 2017-06-01 DIAGNOSIS — R05 Cough: Secondary | ICD-10-CM | POA: Diagnosis not present

## 2017-06-01 DIAGNOSIS — R059 Cough, unspecified: Secondary | ICD-10-CM

## 2017-06-01 MED ORDER — BENZONATATE 100 MG PO CAPS: 100.0000 mg | ORAL_CAPSULE | Freq: Three times a day (TID) | ORAL | 0 refills | Status: DC | PRN

## 2017-06-01 NOTE — Progress Notes (Signed)
MRN: [id number] DOB: [birth year]  Subjective:   Brad Price is a 46 y.o. male presenting for chief complaint of Facial Pain (X 1 day) and Cough (X 1-2 days) .  Reports 2 day history of sinus congestion, sinus pain, ear fullness, sore throat, hoarseness, and dry cough (no hemoptysis). Has tried allegra and sudafed with moderate relief. He has flonase at home but has not used it for this yet. Denies fever, wheezing, shortness of breath, chest tightness, chest pain and myalgia, night sweats, chills, nausea, vomiting, abdominal pain and diarrhea. Has not had sick contact with anyone. Has history of seasonal allergies and has history of asthma. Patient has not had flu shot this season. Former smoker, quit over 20 years ago. Denies any other aggravating or relieving factors, no other questions or concerns.  Brad Price has a current medication list which includes the following prescription(s): albuterol, auvi-q, budesonide-formoterol, fexofenadine, and acetaminophen. Also has No Known Allergies.  Brad Price  has a past medical history of Allergy, Asthma, Hymenoptera allergy, and Meniere's disease of both ears (2015). Also  has a past surgical history that includes Tympanostomy tube placement.   Objective:   Vitals: BP 118/78 (BP Location: Left Arm, Patient Position: Sitting, Cuff Size: Normal)   Pulse 82   Temp 98.6 F (37 C) (Oral)   Resp 18   Ht 6' 0.32" (1.837 m)   Wt 172 lb (78 kg)   SpO2 99%   BMI 23.12 kg/m   Physical Exam  Constitutional: He is oriented to person, place, and time. He appears well-developed and well-nourished. No distress.  HENT:  Head: Normocephalic and atraumatic.  Right Ear: Tympanic membrane is scarred and retracted. Tympanic membrane is not erythematous.  Left Ear: Tympanic membrane is scarred and retracted. Tympanic membrane is not erythematous.  Nose: Mucosal edema (moderate bilaterally) present. Right sinus exhibits no maxillary sinus tenderness and no frontal sinus  tenderness. Left sinus exhibits no maxillary sinus tenderness and no frontal sinus tenderness.  Mouth/Throat: Uvula is midline and mucous membranes are normal. Posterior oropharyngeal erythema present. Tonsils are 1+ on the right. Tonsils are 1+ on the left. No tonsillar exudate.  Eyes: Conjunctivae are normal.  Neck: Normal range of motion.  Cardiovascular: Normal rate, regular rhythm, normal heart sounds and intact distal pulses.  Pulmonary/Chest: Effort normal and breath sounds normal. He has no wheezes. He has no rhonchi. He has no rales.  Lymphadenopathy:       Head (right side): No submental, no submandibular, no tonsillar, no preauricular, no posterior auricular and no occipital adenopathy present.       Head (left side): No submental, no submandibular, no tonsillar, no preauricular, no posterior auricular and no occipital adenopathy present.    He has no cervical adenopathy.       Right: No supraclavicular adenopathy present.       Left: No supraclavicular adenopathy present.  Neurological: He is alert and oriented to person, place, and time.  Skin: Skin is warm and dry.  Psychiatric: He has a normal mood and affect.  Vitals reviewed.  No results found for this or any previous visit (from the past 24 hour(s)).  Assessment and Plan :  1. Sinus pressure 2. Nasal congestion 3. Cough - benzonatate (TESSALON) 100 MG capsule; Take 1-2 capsules (100-200 mg total) 3 (three) times daily as needed by mouth for cough.  Dispense: 40 capsule; Refill: 0 4. Acute upper respiratory infection History and physical exam findings consistent with URI.  Vitals are stable.  He is afebrile.  Patient is well-appearing.  Educated that this is likely a viral etiology.  Recommended rest and symptomatic relief with continued Sudafed, Allegra, Flonase, and Tessalon Perles.  Recommended patient get a cool mist humidifier to use at home.  Due to patient's history of allergies and asthma, encouraged him to contact  our office and speak with me if he is not improving in 3-5 days, as he may warrant an antibiotic at that time.  Return to office sooner if symptoms worsen.  Tenna Delaine, PA-C  [facility] 06/01/2017 5:07 PM

## 2017-06-01 NOTE — Patient Instructions (Addendum)
- We will treat this as a respiratory viral infection.  - I recommend you rest, drink plenty of fluids, eat light meals including soups.  -Also recommend to get a warm or cool mist humidifier to put in her room at nighttime. -In terms of medication, I recommend you take Sudafed twice daily.  Take Allegra once daily.  Use Flonase up to 2 times a day.  I have given a prescription for Tessalon Perles to use as a cough suppressant.   If you develop a dry hacking cough that keeps you up at nighttime, please call our office and let me know so I can send you in a cough syrup with codeine. - You may also use Tylenol or ibuprofen over-the-counter for your sore throat.  - Please call our office and let me know if you are not seeing any improvement or get worse in 3-5 days.  Thank you for letting me participate in your health and well being.    Sinusitis, Adult Sinusitis is soreness and inflammation of your sinuses. Sinuses are hollow spaces in the bones around your face. They are located:  Around your eyes.  In the middle of your forehead.  Behind your nose.  In your cheekbones.  Your sinuses and nasal passages are lined with a stringy fluid (mucus). Mucus normally drains out of your sinuses. When your nasal tissues get inflamed or swollen, the mucus can get trapped or blocked so air cannot flow through your sinuses. This lets bacteria, viruses, and funguses grow, and that leads to infection. Follow these instructions at home: Medicines  Take, use, or apply over-the-counter and prescription medicines only as told by your doctor. These may include nasal sprays.  If you were prescribed an antibiotic medicine, take it as told by your doctor. Do not stop taking the antibiotic even if you start to feel better. Hydrate and Humidify  Drink enough water to keep your pee (urine) clear or pale yellow.  Use a cool mist humidifier to keep the humidity level in your home above 50%.  Breathe in steam  for 10-15 minutes, 3-4 times a day or as told by your doctor. You can do this in the bathroom while a hot shower is running.  Try not to spend time in cool or dry air. Rest  Rest as much as possible.  Sleep with your head raised (elevated).  Make sure to get enough sleep each night. General instructions  Put a warm, moist washcloth on your face 3-4 times a day or as told by your doctor. This will help with discomfort.  Wash your hands often with soap and water. If there is no soap and water, use hand sanitizer.  Do not smoke. Avoid being around people who are smoking (secondhand smoke).  Keep all follow-up visits as told by your doctor. This is important. Contact a doctor if:  You have a fever.  Your symptoms get worse.  Your symptoms do not get better within 10 days. Get help right away if:  You have a very bad headache.  You cannot stop throwing up (vomiting).  You have pain or swelling around your face or eyes.  You have trouble seeing.  You feel confused.  Your neck is stiff.  You have trouble breathing. This information is not intended to replace advice given to you by your health care provider. Make sure you discuss any questions you have with your health care provider. Document Released: 12/29/2007 Document Revised: 03/07/2016 Document Reviewed: 05/07/2015 Elsevier Interactive  Patient Education  [employer].    IF you received an x-ray today, you will receive an invoice from [facility]. Please contact [facility] at [phone] with questions or concerns regarding your invoice.   IF you received labwork today, you will receive an invoice from [facility]. Please contact [facility] at [phone] with questions or concerns regarding your invoice.   Our billing staff will not be able to assist you with questions regarding bills from these companies.  You will be contacted with the lab results as soon as they are available. The fastest  way to get your results is to activate your My Chart account. Instructions are located on the last page of this paperwork. If you have not heard from [place] regarding the results in 2 weeks, please contact this office.

## 2018-04-12 ENCOUNTER — Other Ambulatory Visit: Payer: Self-pay | Admitting: Allergy and Immunology

## 2018-04-12 NOTE — Telephone Encounter (Signed)
Courtesy refill  

## 2018-07-08 ENCOUNTER — Other Ambulatory Visit: Payer: Self-pay | Admitting: Allergy and Immunology

## 2018-07-21 IMAGING — DX DG FOOT COMPLETE 3+V*R*
3 series · 3 of 3 positions shown · non-contrast
Comparison: None.

CLINICAL DATA: Pain, swelling and redness involving the great toe.

EXAM:
RIGHT FOOT COMPLETE - 3+ VIEW

[foot ap]
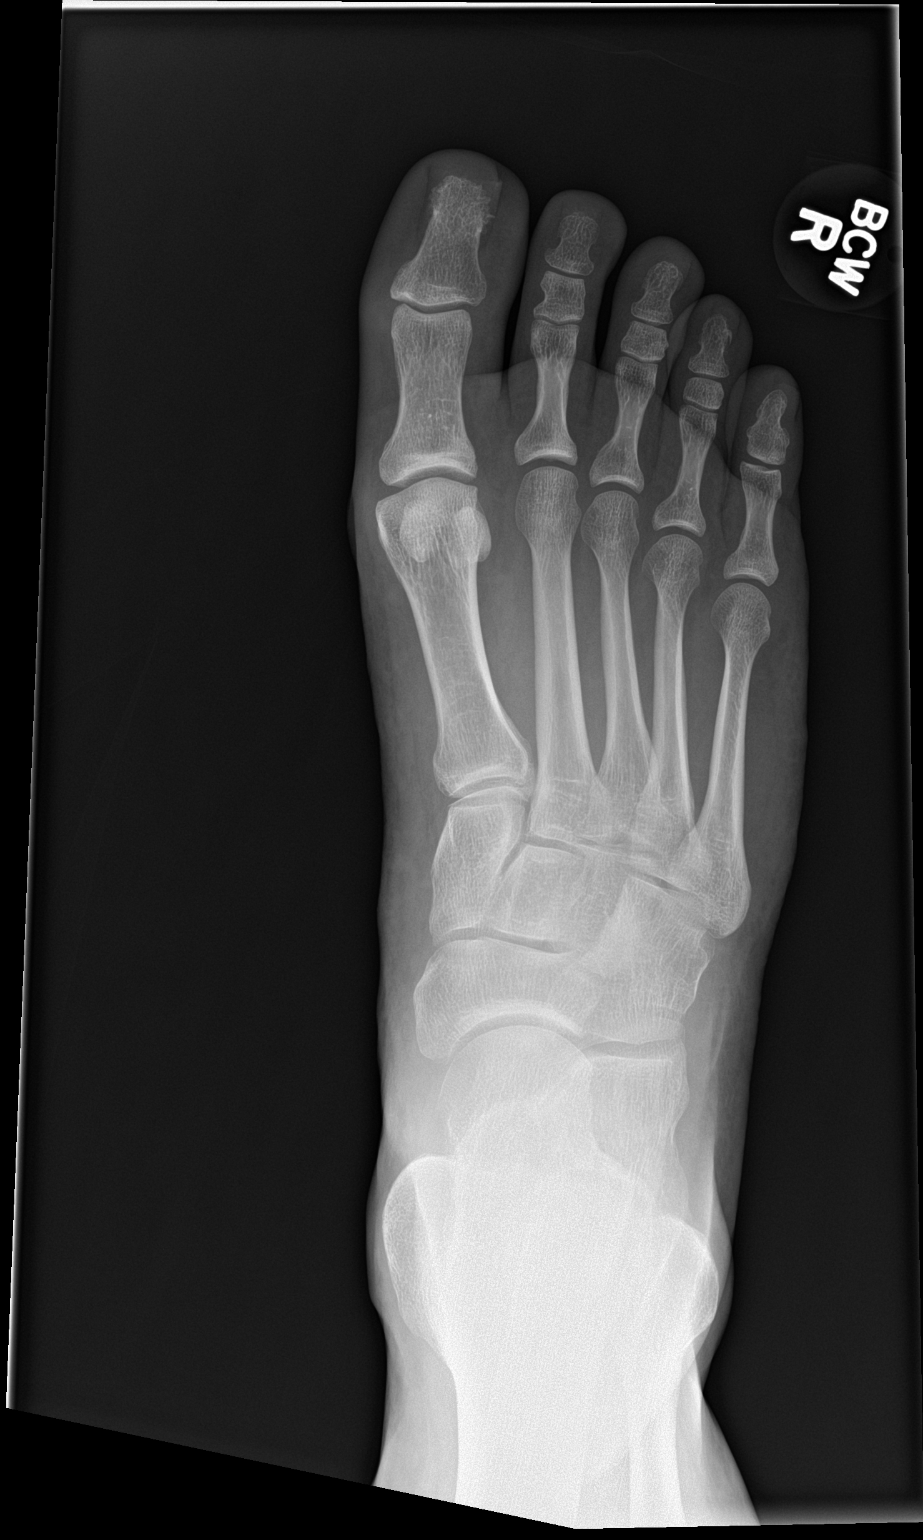

[foot lat]
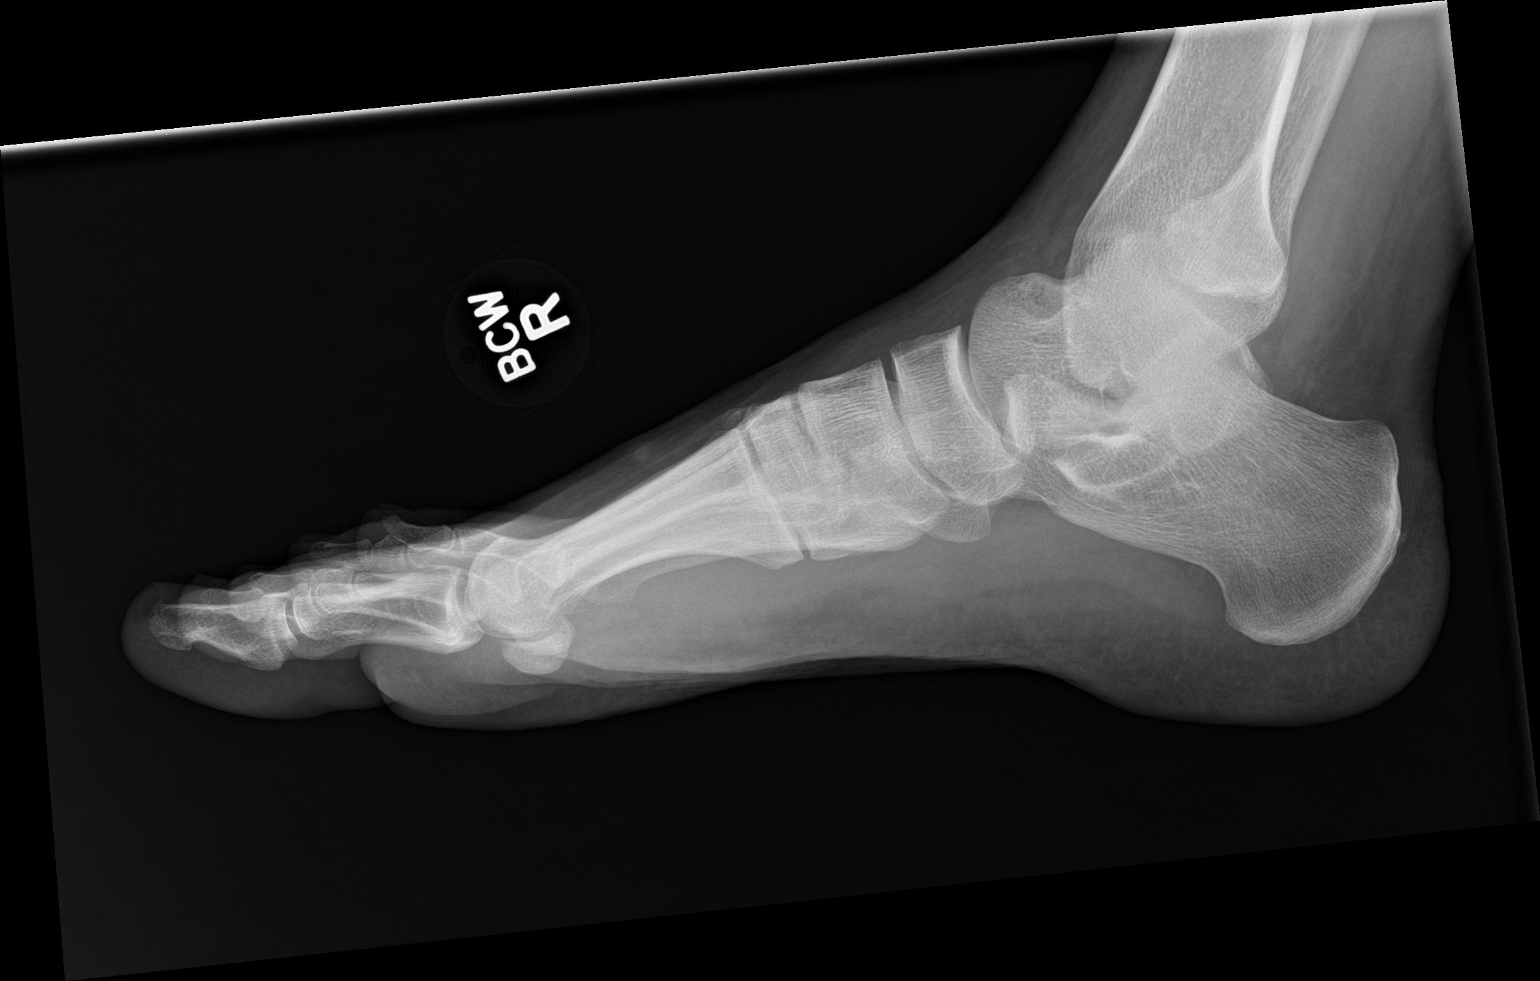

[foot obl]
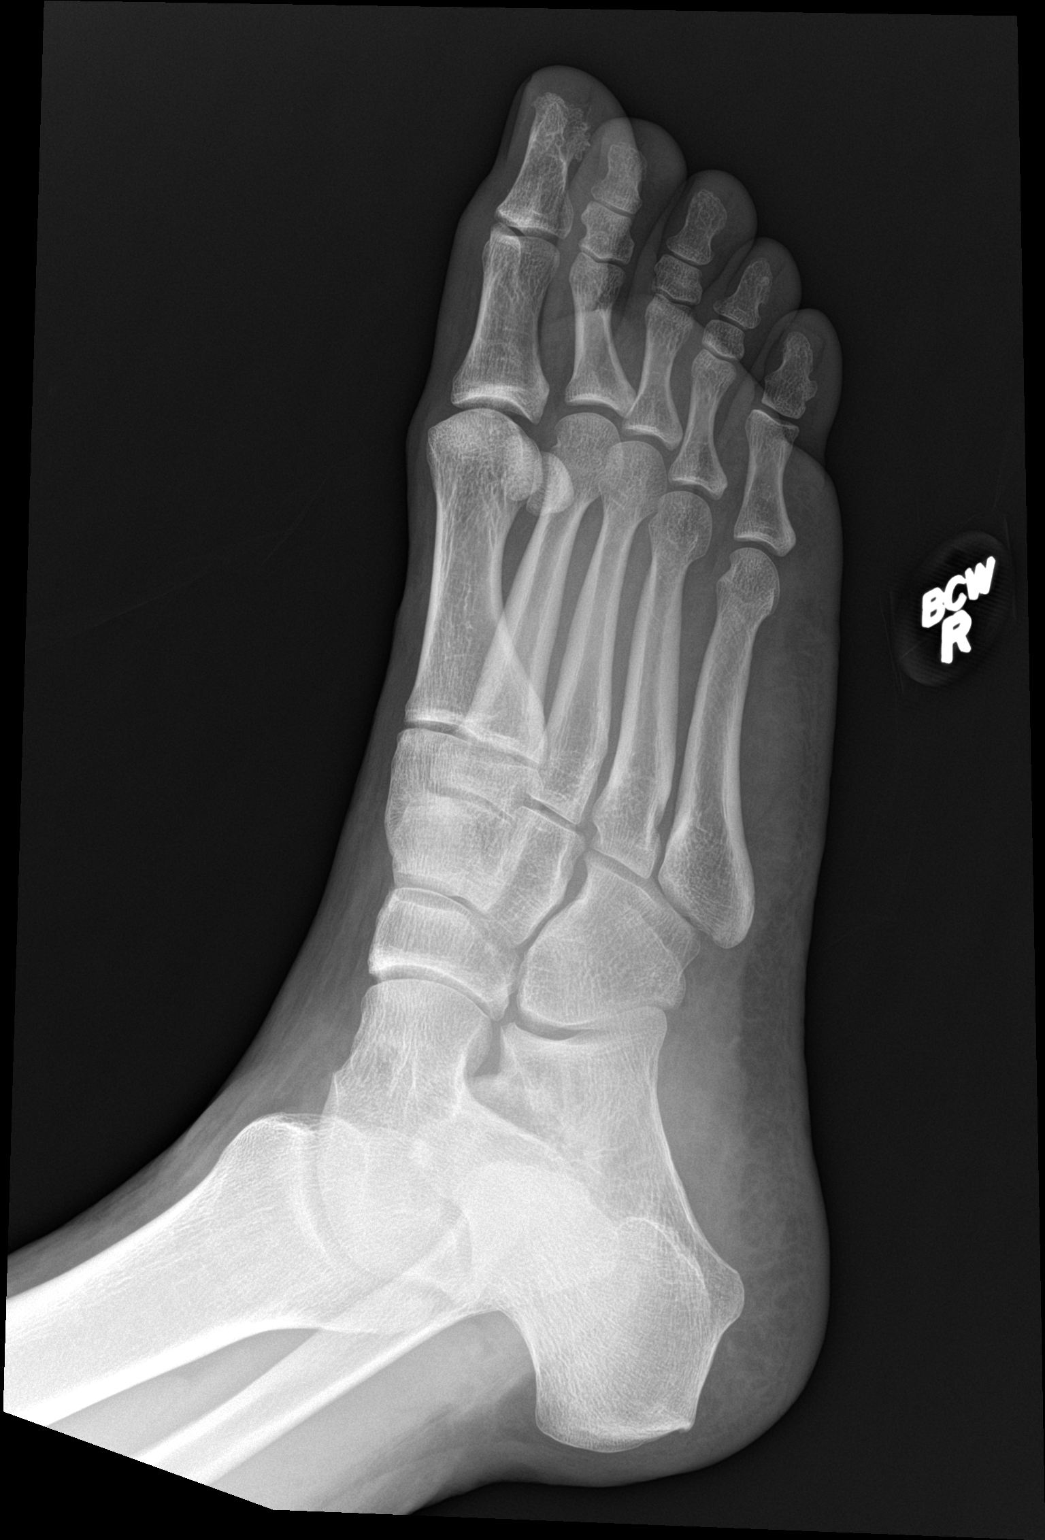

[3 of 3 positions shown; findings below may reference images not displayed]

FINDINGS: The joint spaces are maintained. No acute bony findings. No erosive
changes.
IMPRESSION: Normal right foot radiographs.

## 2018-07-28 ENCOUNTER — Telehealth: Payer: Self-pay | Admitting: *Deleted

## 2018-07-28 MED ORDER — BUDESONIDE-FORMOTEROL FUMARATE 160-4.5 MCG/ACT IN AERO: INHALATION_SPRAY | RESPIRATORY_TRACT | 0 refills | Status: DC

## 2018-07-28 NOTE — Telephone Encounter (Signed)
[facility]  Scheduled for 08/29/2018 with Kozlow.

## 2018-07-28 NOTE — Telephone Encounter (Signed)
Patient called to get a refill on symbicort until he can schedule an appointment. Please advise

## 2018-07-28 NOTE — Telephone Encounter (Signed)
LM for patient to return call. Can give courtesy refill, but patient will need an appointment for future refills.

## 2018-08-19 ENCOUNTER — Other Ambulatory Visit: Payer: Self-pay | Admitting: Allergy and Immunology

## 2018-08-29 ENCOUNTER — Encounter: Payer: Self-pay | Admitting: Allergy and Immunology

## 2018-08-29 ENCOUNTER — Ambulatory Visit (INDEPENDENT_AMBULATORY_CARE_PROVIDER_SITE_OTHER): Payer: 59 | Admitting: Allergy and Immunology

## 2018-08-29 VITALS — BP 100/60 | HR 72 | Resp 16 | Ht 70.0 in | Wt 164.0 lb

## 2018-08-29 DIAGNOSIS — J3089 Other allergic rhinitis: Secondary | ICD-10-CM | POA: Diagnosis not present

## 2018-08-29 DIAGNOSIS — T63441D Toxic effect of venom of bees, accidental (unintentional), subsequent encounter: Secondary | ICD-10-CM

## 2018-08-29 DIAGNOSIS — IMO0001 Reserved for inherently not codable concepts without codable children: Secondary | ICD-10-CM

## 2018-08-29 DIAGNOSIS — J4540 Moderate persistent asthma, uncomplicated: Secondary | ICD-10-CM | POA: Diagnosis not present

## 2018-08-29 MED ORDER — BUDESONIDE-FORMOTEROL FUMARATE 160-4.5 MCG/ACT IN AERO: INHALATION_SPRAY | RESPIRATORY_TRACT | 5 refills | Status: DC

## 2018-08-29 MED ORDER — ALBUTEROL SULFATE HFA 108 (90 BASE) MCG/ACT IN AERS: INHALATION_SPRAY | RESPIRATORY_TRACT | 1 refills | Status: DC

## 2018-08-29 NOTE — Patient Instructions (Addendum)
  1. Continue Symbicort 160 2 inhalations twice a day.    2. Continue Flonase 1-2 sprays each nostril one time per day during periods of upper airway symptoms.  3. Continue Ventolin HFA 2 puffs every 4-6 hours if needed  4. Continue AUVI-Q 0.3 if needed  5. Continue antihistamine if needed.       6. Return to clinic in 1 year or earlier if problem

## 2018-08-29 NOTE — Progress Notes (Signed)
Follow-up Note  Referring Provider: No ref. provider found Primary Provider: Robyn Haber, MD Date of Office Visit: 08/29/2018  Subjective:   Brad Price (DOB: 16-Apr-1971) is a 48 y.o. male who returns to the [place] on 08/29/2018 in re-evaluation of the following:  HPI: Brad Price returns to this clinic in reevaluation of his asthma and allergic rhinitis and hymenoptera venom hypersensitivity state.  His last visit to this clinic was 10 May 2017.  Asthma has not been particularly active while consistently using Symbicort.  Rarely does use a short acting bronchodilator and he has not required a systemic steroid or antibiotic to treat any type of respiratory tract issue.  He has had very little issues with his nose and only uses Flonase intermittently.  He does have an injectable epinephrine device for his hymenoptera venom hypersensitivity state.  He is not interested in undergoing a course of immunotherapy.  He does not receive the flu vaccine.  Allergies as of 08/29/2018      Reactions   Bee Venom Anaphylaxis      Medication List      albuterol 108 (90 Base) MCG/ACT inhaler Commonly known as:  PROAIR HFA INHALE 2 PUFFS EVERY 4-6 HOURS AS NEEDED FOR COUGH/WHEEZING.   AUVI-Q 0.3 mg/0.3 mL Soaj injection Generic drug:  EPINEPHrine Use as directed for life-threatening allergic reaction.   budesonide-formoterol 160-4.5 MCG/ACT inhaler Commonly known as:  SYMBICORT INHALE 2 PUFFS INTO THE LUNGS TWICE DAILY TO PREVENT COUGH OR WHEEZE. RINSE GARGLE AND SPIT AFTER USE   fexofenadine 180 MG tablet Commonly known as:  ALLEGRA Take 180 mg by mouth daily.   meclizine 25 MG tablet Commonly known as:  ANTIVERT Take by mouth.       Past Medical History:  Diagnosis Date  . Allergy   . Asthma   . Hymenoptera allergy   . Meniere's disease of both ears 2015    Past Surgical History:  Procedure Laterality Date  . TYMPANOSTOMY TUBE PLACEMENT       Review of systems negative except as noted in HPI / PMHx or noted below:  Review of Systems  Constitutional: Negative.   HENT: Negative.   Eyes: Negative.   Respiratory: Negative.   Cardiovascular: Negative.   Gastrointestinal: Negative.   Genitourinary: Negative.   Musculoskeletal: Negative.   Skin: Negative.   Neurological: Negative.   Endo/Heme/Allergies: Negative.   Psychiatric/Behavioral: Negative.      Objective:   Vitals:   08/29/18 1334  BP: 100/60  Pulse: 72  Resp: 16  SpO2: 98%   Height: 5\' 10"  (177.8 cm)  Weight: 164 lb (74.4 kg)   Physical Exam Constitutional:      Appearance: He is not diaphoretic.  HENT:     Head: Normocephalic.     Right Ear: Ear canal and external ear normal. Tympanic membrane is scarred.     Left Ear: Ear canal and external ear normal. Tympanic membrane is scarred.     Nose: Nose normal. No mucosal edema or rhinorrhea.     Mouth/Throat:     Pharynx: Uvula midline. No oropharyngeal exudate.  Eyes:     Conjunctiva/sclera: Conjunctivae normal.  Neck:     Thyroid: No thyromegaly.     Trachea: Trachea normal. No tracheal tenderness or tracheal deviation.  Cardiovascular:     Rate and Rhythm: Normal rate and regular rhythm.     Heart sounds: Normal heart sounds, S1 normal and S2 normal. No murmur.  Pulmonary:  Effort: No respiratory distress.     Breath sounds: Normal breath sounds. No stridor. No wheezing or rales.  Lymphadenopathy:     Head:     Right side of head: No tonsillar adenopathy.     Left side of head: No tonsillar adenopathy.     Cervical: No cervical adenopathy.  Skin:    Findings: No erythema or rash.     Nails: There is no clubbing.   Neurological:     Mental Status: He is alert.     Diagnostics:    Spirometry was performed and demonstrated an FEV1 of 2.31 at 58 % of predicted.  The patient had an Asthma Control Test with the following results: ACT Total Score: 23.    Assessment and Plan:    1. Asthma, moderate persistent, well-controlled   2. Other allergic rhinitis   3. Hymenoptera reaction, accidental or unintentional, subsequent encounter     1. Continue Symbicort 160 2 inhalations twice a day.    2. Continue Flonase 1-2 sprays each nostril one time per day during periods of upper airway symptoms.  3. Continue Ventolin HFA 2 puffs every 4-6 hours if needed  4. Continue AUVI-Q 0.3 if needed  5. Continue antihistamine if needed.       6. Return to clinic in 1 year or earlier if problem  Brad Price appears to be doing quite well at this point in time regarding his respiratory tract issue.  He has a very good understanding of his disease state and how the medications work and the appropriate use of these medications.  He has not interested in undergoing a course of immunotherapy at this point in time regarding his hymenoptera venom hypersensitivity state.  I will see him back in this clinic in 1 year or earlier if there is a problem.  [name], MD Allergy / Immunology [place]

## 2018-08-30 ENCOUNTER — Encounter: Payer: Self-pay | Admitting: Allergy and Immunology

## 2018-10-18 ENCOUNTER — Telehealth: Payer: Self-pay | Admitting: Allergy and Immunology

## 2018-10-18 MED ORDER — ALBUTEROL SULFATE HFA 108 (90 BASE) MCG/ACT IN AERS: INHALATION_SPRAY | RESPIRATORY_TRACT | 1 refills | Status: DC

## 2018-10-18 NOTE — Telephone Encounter (Signed)
Requesting a refill for albuterol. [facility]

## 2018-10-18 NOTE — Telephone Encounter (Signed)
Prescription has been sent in. Called patient and advised. Patient verbalized understanding.

## 2019-04-17 ENCOUNTER — Other Ambulatory Visit: Payer: Self-pay | Admitting: Allergy and Immunology

## 2019-07-31 ENCOUNTER — Other Ambulatory Visit: Payer: Self-pay | Admitting: Allergy and Immunology

## 2019-09-04 ENCOUNTER — Other Ambulatory Visit: Payer: Self-pay

## 2019-09-04 ENCOUNTER — Encounter: Payer: Self-pay | Admitting: Allergy and Immunology

## 2019-09-04 ENCOUNTER — Ambulatory Visit (INDEPENDENT_AMBULATORY_CARE_PROVIDER_SITE_OTHER): Payer: 59 | Admitting: Allergy and Immunology

## 2019-09-04 VITALS — BP 128/90 | HR 65 | Temp 96.1°F | Resp 16 | Ht 72.0 in | Wt 168.4 lb

## 2019-09-04 DIAGNOSIS — T63441D Toxic effect of venom of bees, accidental (unintentional), subsequent encounter: Secondary | ICD-10-CM | POA: Diagnosis not present

## 2019-09-04 DIAGNOSIS — J4540 Moderate persistent asthma, uncomplicated: Secondary | ICD-10-CM | POA: Diagnosis not present

## 2019-09-04 DIAGNOSIS — J3089 Other allergic rhinitis: Secondary | ICD-10-CM

## 2019-09-04 DIAGNOSIS — IMO0001 Reserved for inherently not codable concepts without codable children: Secondary | ICD-10-CM

## 2019-09-04 MED ORDER — BUDESONIDE-FORMOTEROL FUMARATE 160-4.5 MCG/ACT IN AERO: INHALATION_SPRAY | RESPIRATORY_TRACT | 5 refills | Status: DC

## 2019-09-04 MED ORDER — ALBUTEROL SULFATE HFA 108 (90 BASE) MCG/ACT IN AERS: INHALATION_SPRAY | RESPIRATORY_TRACT | 1 refills | Status: DC

## 2019-09-04 NOTE — Progress Notes (Signed)
[place] [facility] [place] [facility] [place]   Follow-up Note  Referring Provider: Robyn Haber, MD Primary Provider: Robyn Haber, MD Date of Office Visit: 09/04/2019  Subjective:   Brad Price (DOB: 14-Nov-1970) is a 49 y.o. male who returns to the [name] on 09/04/2019 in re-evaluation of the following:  HPI: Brad Price returns to this clinic in reevaluation of his asthma and allergic rhinitis and history of hymenoptera venom hypersensitivity state.  His last visit to this clinic was 29 August 2018.  During the past year his asthma has been under excellent control while using his Symbicort mostly 1 time per day.  Rarely does use a short acting bronchodilator.  He has not had any difficulty with exercise.  He has not required a systemic steroid.  His nose is doing quite well and he has discontinued the use of Flonase.  He uses Allegra at this point.  He has not required an antibiotic treat an episode of sinusitis.  He is avoiding hymenoptera venom exposure and continues to carry an EpiPen.  He does not receive the flu vaccine but he is interested in receiving the Covid vaccine.  Allergies as of 09/04/2019      Reactions   Bee Venom Anaphylaxis      Medication List      albuterol 108 (90 Base) MCG/ACT inhaler Commonly known as: VENTOLIN HFA INHALE 2 PUFFS EVERY 4-6 HOURS AS NEEDED FOR COUGH/WHEEZING.   Auvi-Q 0.3 mg/0.3 mL Soaj injection Generic drug: EPINEPHrine Use as directed for life-threatening allergic reaction.   budesonide-formoterol 160-4.5 MCG/ACT inhaler Commonly known as: Symbicort INHALE 2 PUFFS INTO THE LUNGS TWICE DAILY TO PREVENT COUGH OR WHEEZE. RINSE GARGLE AND SPIT AFTER USE   fexofenadine 180 MG tablet Commonly known as: ALLEGRA Take 180 mg by mouth daily.   meclizine 25 MG tablet Commonly known as: ANTIVERT Take by mouth.       Past Medical History:  Diagnosis Date  . Allergy   . Asthma   . Hymenoptera  allergy   . Meniere's disease of both ears 2015    Past Surgical History:  Procedure Laterality Date  . TYMPANOSTOMY TUBE PLACEMENT      Review of systems negative except as noted in HPI / PMHx or noted below:  Review of Systems  Constitutional: Negative.   HENT: Negative.   Eyes: Negative.   Respiratory: Negative.   Cardiovascular: Negative.   Gastrointestinal: Negative.   Genitourinary: Negative.   Musculoskeletal: Negative.   Skin: Negative.   Neurological: Negative.   Endo/Heme/Allergies: Negative.   Psychiatric/Behavioral: Negative.      Objective:   Vitals:   09/04/19 1548  BP: 128/90  Pulse: 65  Resp: 16  Temp: (!) 96.1 F (35.6 C)  SpO2: 98%   Height: 6' (182.9 cm)  Weight: 168 lb 6.4 oz (76.4 kg)   Physical Exam Constitutional:      Appearance: He is not diaphoretic.  HENT:     Head: Normocephalic.     Right Ear: Tympanic membrane, ear canal and external ear normal.     Left Ear: Tympanic membrane, ear canal and external ear normal.     Nose: Nose normal. No mucosal edema or rhinorrhea.     Mouth/Throat:     Pharynx: Uvula midline. No oropharyngeal exudate.  Eyes:     Conjunctiva/sclera: Conjunctivae normal.  Neck:     Thyroid: No thyromegaly.     Trachea: Trachea normal. No tracheal tenderness or tracheal  deviation.  Cardiovascular:     Rate and Rhythm: Normal rate and regular rhythm.     Heart sounds: Normal heart sounds, S1 normal and S2 normal. No murmur.  Pulmonary:     Effort: No respiratory distress.     Breath sounds: Normal breath sounds. No stridor. No wheezing or rales.  Lymphadenopathy:     Head:     Right side of head: No tonsillar adenopathy.     Left side of head: No tonsillar adenopathy.     Cervical: No cervical adenopathy.  Skin:    Findings: No erythema or rash.     Nails: There is no clubbing.  Neurological:     Mental Status: He is alert.     Diagnostics:    Spirometry was performed and demonstrated an FEV1 of  2.80 at 66 % of predicted.    Assessment and Plan:   1. Asthma, moderate persistent, well-controlled   2. Other allergic rhinitis   3. Hymenoptera reaction, accidental or unintentional, subsequent encounter     1. Continue Symbicort 160 2 inhalations 1-2 times per day depending on disease activity.    2. Continue Ventolin HFA 2 puffs every 4-6 hours if needed  3. Continue AUVI-Q 0.3 if needed  4. Continue antihistamine if needed.       5. Return to clinic in 1 year or earlier if problem  6. Obtain Covid vaccine   7. BP= 120/90  Brad Price appears to be doing very well on his current plan.  He has a very good understanding of his disease state and his medications and appropriate dosing of his medications depending on disease activity.  He is not interested in undergoing a course of immunotherapy directed against hymenoptera venom at this point.  Assuming he continues to do well with this therapy I will see him back in his clinic in 1 year or earlier.  We did inform him about his diastolic blood pressure of 90 during today's visit.  [name], MD Allergy / Immunology [place]

## 2019-09-04 NOTE — Patient Instructions (Addendum)
  1. Continue Symbicort 160 2 inhalations 1-2 times per day depending on disease activity.    2. Continue Ventolin HFA 2 puffs every 4-6 hours if needed  3. Continue AUVI-Q 0.3 if needed  4. Continue antihistamine if needed.       5. Return to clinic in 1 year or earlier if problem  6. Obtain Covid vaccine   7. BP= 120/90

## 2019-09-05 ENCOUNTER — Encounter: Payer: Self-pay | Admitting: Allergy and Immunology

## 2019-10-28 ENCOUNTER — Other Ambulatory Visit: Payer: Self-pay | Admitting: Allergy and Immunology

## 2019-11-16 ENCOUNTER — Telehealth: Payer: Self-pay | Admitting: Allergy and Immunology

## 2019-11-16 MED ORDER — EPINEPHRINE 0.3 MG/0.3ML IJ SOAJ: 0.3000 mg | Freq: Once | INTRAMUSCULAR | 1 refills | Status: AC

## 2019-11-16 NOTE — Telephone Encounter (Signed)
Patient called and needs to have epi-pen called into [facility] on [address]. [phone].

## 2019-11-16 NOTE — Telephone Encounter (Signed)
Prescription has been sent in to requested pharmacy. Called patient and advised. Patient verbalized understanding.

## 2020-08-02 ENCOUNTER — Other Ambulatory Visit: Payer: Self-pay | Admitting: Allergy and Immunology

## 2020-09-09 ENCOUNTER — Other Ambulatory Visit: Payer: Self-pay

## 2020-09-09 ENCOUNTER — Ambulatory Visit (INDEPENDENT_AMBULATORY_CARE_PROVIDER_SITE_OTHER): Payer: 59 | Admitting: Allergy and Immunology

## 2020-09-09 VITALS — BP 122/78 | HR 61 | Temp 97.6°F | Resp 14 | Ht 72.0 in | Wt 161.0 lb

## 2020-09-09 DIAGNOSIS — J4540 Moderate persistent asthma, uncomplicated: Secondary | ICD-10-CM | POA: Diagnosis not present

## 2020-09-09 DIAGNOSIS — T63481A Toxic effect of venom of other arthropod, accidental (unintentional), initial encounter: Secondary | ICD-10-CM

## 2020-09-09 DIAGNOSIS — J3089 Other allergic rhinitis: Secondary | ICD-10-CM

## 2020-09-09 DIAGNOSIS — T63481D Toxic effect of venom of other arthropod, accidental (unintentional), subsequent encounter: Secondary | ICD-10-CM | POA: Diagnosis not present

## 2020-09-09 MED ORDER — BUDESONIDE-FORMOTEROL FUMARATE 160-4.5 MCG/ACT IN AERO: INHALATION_SPRAY | RESPIRATORY_TRACT | 3 refills | Status: AC

## 2020-09-09 MED ORDER — ALBUTEROL SULFATE HFA 108 (90 BASE) MCG/ACT IN AERS: INHALATION_SPRAY | RESPIRATORY_TRACT | 3 refills | Status: DC

## 2020-09-09 MED ORDER — FEXOFENADINE HCL 180 MG PO TABS: 180.0000 mg | ORAL_TABLET | Freq: Every day | ORAL | 5 refills | Status: AC

## 2020-09-09 MED ORDER — AUVI-Q 0.3 MG/0.3ML IJ SOAJ: INTRAMUSCULAR | 2 refills | Status: DC

## 2020-09-09 NOTE — Patient Instructions (Addendum)
  1. Continue Symbicort 160 2 inhalations 1-2 times per day depending on disease activity.    2. Continue Ventolin HFA 2 puffs every 4-6 hours if needed  3. Continue Epi-PEn if needed  4. Continue antihistamine if needed.       5. Return to clinic in 1 year or earlier if problem

## 2020-09-09 NOTE — Progress Notes (Signed)
[place] [facility] [place] [facility] [place]   Follow-up Note  Referring Provider: Robyn Haber, MD Primary Provider: Robyn Haber, MD Date of Office Visit: 09/09/2020  Subjective:   Brad Price (DOB: 1971/02/25) is a 50 y.o. male who returns to the [facility] on 09/09/2020 in re-evaluation of the following:  HPI: Brad Price returns to this clinic in evaluation of asthma and allergic rhinitis and history of hymenoptera venom hypersensitivity state.  His last visit to this clinic was 04 September 2019.  He has had an excellent interval of time regarding his airway issue.  He has not required a systemic steroid or antibiotic for any type of airway issue.  He continues to use Symbicort mostly 1 time per day and rarely uses any nasal steroid.  His use of a short acting bronchodilator is almost nonexistent and he can exercise without any difficulty.  He continues to carry an EpiPen regarding his hymenoptera venom hypersensitivity state.  He has received 3 [facility] vaccines.  Allergies as of 09/09/2020      Reactions   Bee Venom Anaphylaxis      Medication List    albuterol 108 (90 Base) MCG/ACT inhaler Commonly known as: VENTOLIN HFA INHALE 2 PUFFS BY MOUTH TWICE A DAY   Auvi-Q 0.3 mg/0.3 mL Soaj injection Generic drug: EPINEPHrine Use as directed for life-threatening allergic reaction.   budesonide-formoterol 160-4.5 MCG/ACT inhaler Commonly known as: SYMBICORT INHALE 2 PUFFS INTO THE LUNGS DAILY TO PREVENT COUGH OR WHEEZE, RINSE, GARGLE, AND SPIT AFTER USE   fexofenadine 180 MG tablet Commonly known as: ALLEGRA Take 1 tablet (180 mg total) by mouth daily.       Past Medical History:  Diagnosis Date  . Allergy   . Asthma   . Hymenoptera allergy   . Meniere's disease of both ears 2015    Past Surgical History:  Procedure Laterality Date  . TYMPANOSTOMY TUBE PLACEMENT      Review of systems negative except as noted in HPI / PMHx  or noted below:  Review of Systems  Constitutional: Negative.   HENT: Negative.   Eyes: Negative.   Respiratory: Negative.   Cardiovascular: Negative.   Gastrointestinal: Negative.   Genitourinary: Negative.   Musculoskeletal: Negative.   Skin: Negative.   Neurological: Negative.   Endo/Heme/Allergies: Negative.   Psychiatric/Behavioral: Negative.      Objective:   Vitals:   09/09/20 1547  BP: 122/78  Pulse: 61  Resp: 14  Temp: 97.6 F (36.4 C)  SpO2: 100%   Height: 6' (182.9 cm)  Weight: 161 lb (73 kg)   Physical Exam Constitutional:      Appearance: He is not diaphoretic.  HENT:     Head: Normocephalic.     Right Ear: Tympanic membrane, ear canal and external ear normal.     Left Ear: Tympanic membrane, ear canal and external ear normal.     Nose: Nose normal. No mucosal edema or rhinorrhea.     Mouth/Throat:     Mouth: Oropharynx is clear and moist and mucous membranes are normal.     Pharynx: Uvula midline. No oropharyngeal exudate.  Eyes:     Conjunctiva/sclera: Conjunctivae normal.  Neck:     Thyroid: No thyromegaly.     Trachea: Trachea normal. No tracheal tenderness or tracheal deviation.  Cardiovascular:     Rate and Rhythm: Normal rate and regular rhythm.     Heart sounds: Normal heart sounds, S1 normal and S2 normal.  No murmur heard.   Pulmonary:     Effort: No respiratory distress.     Breath sounds: Normal breath sounds. No stridor. No wheezing or rales.  Musculoskeletal:        General: No edema.  Lymphadenopathy:     Head:     Right side of head: No tonsillar adenopathy.     Left side of head: No tonsillar adenopathy.     Cervical: No cervical adenopathy.  Skin:    Findings: No erythema or rash.     Nails: There is no clubbing.  Neurological:     Mental Status: He is alert.     Diagnostics:    Spirometry was performed and demonstrated an FEV1 of 1.42 at 34 % of predicted.  He had a less than optimal effort on the spirometric  maneuver.  Assessment and Plan:   1. Asthma, moderate persistent, well-controlled   2. Other allergic rhinitis   3. Reaction to hymenoptera sting    1. Continue Symbicort 160 2 inhalations 1-2 times per day depending on disease activity.    2. Continue Ventolin HFA 2 puffs every 4-6 hours if needed  3. Continue Epi-PEn if needed  4. Continue antihistamine if needed.       5. Return to clinic in 1 year or earlier if problem   Overall Brad Price appears to be doing very well.  He has a good understanding of his disease state and how his medications work and the appropriate use of his medications depending on disease activity.  I refilled all of his medications and we will see him back in his clinic in 1 year or earlier if there is a problem.  Brad Katz, MD Allergy / Immunology [place]

## 2020-09-10 ENCOUNTER — Encounter: Payer: Self-pay | Admitting: Allergy and Immunology

## 2021-01-20 ENCOUNTER — Other Ambulatory Visit: Payer: Self-pay | Admitting: Allergy and Immunology

## 2021-08-03 ENCOUNTER — Other Ambulatory Visit: Payer: Self-pay | Admitting: *Deleted

## 2021-08-31 ENCOUNTER — Other Ambulatory Visit: Payer: Self-pay | Admitting: Allergy and Immunology

## 2021-09-15 ENCOUNTER — Other Ambulatory Visit: Payer: Self-pay

## 2021-09-15 ENCOUNTER — Ambulatory Visit (INDEPENDENT_AMBULATORY_CARE_PROVIDER_SITE_OTHER): Payer: 59 | Admitting: Allergy and Immunology

## 2021-09-15 ENCOUNTER — Encounter: Payer: Self-pay | Admitting: Allergy and Immunology

## 2021-09-15 VITALS — BP 116/76 | HR 67 | Temp 97.8°F | Resp 18 | Ht 71.0 in | Wt 165.8 lb

## 2021-09-15 DIAGNOSIS — J4540 Moderate persistent asthma, uncomplicated: Secondary | ICD-10-CM

## 2021-09-15 DIAGNOSIS — T63481A Toxic effect of venom of other arthropod, accidental (unintentional), initial encounter: Secondary | ICD-10-CM

## 2021-09-15 DIAGNOSIS — J3089 Other allergic rhinitis: Secondary | ICD-10-CM | POA: Diagnosis not present

## 2021-09-15 MED ORDER — ALBUTEROL SULFATE HFA 108 (90 BASE) MCG/ACT IN AERS: INHALATION_SPRAY | RESPIRATORY_TRACT | 2 refills | Status: DC

## 2021-09-15 MED ORDER — FLUTICASONE-SALMETEROL 232-14 MCG/ACT IN AEPB: INHALATION_SPRAY | RESPIRATORY_TRACT | 2 refills | Status: DC

## 2021-09-15 MED ORDER — EPINEPHRINE 0.3 MG/0.3ML IJ SOAJ: INTRAMUSCULAR | 1 refills | Status: AC

## 2021-09-15 NOTE — Patient Instructions (Addendum)
°  1. START AirDuo 232 - 1 inhalations 1-2 times per day (specialty pharmacy)    2. Continue Ventolin HFA 2 puffs every 4-6 hours if needed  3. Continue Epi-PEN if needed  4. Continue antihistamine if needed.       5. Return to clinic in 1 year or earlier if problem

## 2021-09-15 NOTE — Progress Notes (Signed)
[facility] [place] [facility] [place]   Follow-up Note  Referring Provider: Robyn Haber, MD Primary Provider: Robyn Haber, MD Date of Office Visit: 09/15/2021  Subjective:   Brad Price (DOB: 02-18-1971) is a 51 y.o. male who returns to the [facility] on 09/15/2021 in re-evaluation of the following:  HPI: Jaison returns to this clinic in evaluation of asthma, allergic rhinitis, and history of hymenoptera venom hypersensitivity state.  His last visit to this clinic was 09 September 2020.  Overall his asthma has been under very good control and he has not required a systemic steroid to treat an exacerbation and he can exercise by hiking or using an elliptical for 20 minutes with no problem and rarely uses a short acting bronchodilator while relying on the use of Symbicort mostly 1 time per day.  Unfortunately, Symbicort has been prohibitive regarding its expense and it is over $200 at this point.  He had very little problems with his nose.  He has not required an antibiotic to treat an episode of sinusitis.  He did contract COVID in December 2022 with mostly a sinus issue and some fevers and chills without any significant lower airway symptoms.  He did lose his sense of smell for several days.  The total duration of his reaction was about 12 days.  He is improving every week.  He has received 3 [facility] vaccines.  He continues to carry an EpiPen for his hymenoptera venom hypersensitivity state.  Allergies as of 09/15/2021       Reactions   Bee Venom Anaphylaxis        Medication List    albuterol 108 (90 Base) MCG/ACT inhaler Commonly known as: VENTOLIN HFA INHALE 2 PUFFS TWICE A DAY   budesonide-formoterol 160-4.5 MCG/ACT inhaler Commonly known as: SYMBICORT INHALE 2 PUFFS INTO THE LUNGS DAILY TO PREVENT COUGH OR WHEEZE, RINSE, GARGLE, AND SPIT AFTER USE   EPINEPHrine 0.3 mg/0.3 mL Soaj injection Commonly known as:  EPI-PEN INJECT 0.3 MLS (0.3 MG TOTAL) INTO THE MUSCLE ONCE FOR 1 DOSE.   fexofenadine 180 MG tablet Commonly known as: ALLEGRA Take 1 tablet (180 mg total) by mouth daily.    Past Medical History:  Diagnosis Date   Allergy    Asthma    Hymenoptera allergy    Meniere's disease of both ears 2015    Past Surgical History:  Procedure Laterality Date   TYMPANOSTOMY TUBE PLACEMENT      Review of systems negative except as noted in HPI / PMHx or noted below:  Review of Systems  Constitutional: Negative.   HENT: Negative.    Eyes: Negative.   Respiratory: Negative.    Cardiovascular: Negative.   Gastrointestinal: Negative.   Genitourinary: Negative.   Musculoskeletal: Negative.   Skin: Negative.   Neurological: Negative.   Endo/Heme/Allergies: Negative.   Psychiatric/Behavioral: Negative.      Objective:   Vitals:   09/15/21 1540  BP: 116/76  Pulse: 67  Resp: 18  Temp: 97.8 F (36.6 C)  SpO2: 100%   Height: 5\' 11"  (180.3 cm)  Weight: 165 lb 12.8 oz (75.2 kg)   Physical Exam Constitutional:      Appearance: He is not diaphoretic.  HENT:     Head: Normocephalic.     Right Ear: Tympanic membrane, ear canal and external ear normal.     Left Ear: Tympanic membrane, ear canal and external ear normal.     Nose: Nose normal. No  mucosal edema or rhinorrhea.     Mouth/Throat:     Pharynx: Uvula midline. No oropharyngeal exudate.  Eyes:     Conjunctiva/sclera: Conjunctivae normal.  Neck:     Thyroid: No thyromegaly.     Trachea: Trachea normal. No tracheal tenderness or tracheal deviation.  Cardiovascular:     Rate and Rhythm: Normal rate and regular rhythm.     Heart sounds: Normal heart sounds, S1 normal and S2 normal. No murmur heard. Pulmonary:     Effort: No respiratory distress.     Breath sounds: Normal breath sounds. No stridor. No wheezing or rales.  Lymphadenopathy:     Head:     Right side of head: No tonsillar adenopathy.     Left side of head: No  tonsillar adenopathy.     Cervical: No cervical adenopathy.  Skin:    Findings: No erythema or rash.     Nails: There is no clubbing.  Neurological:     Mental Status: He is alert.    Diagnostics:    Spirometry was performed and demonstrated an FEV1 of 2.29 at 57 % of predicted.  Assessment and Plan:   1. Asthma, moderate persistent, well-controlled   2. Other allergic rhinitis   3. Reaction to hymenoptera sting     1. START AirDuo 232 - 1 inhalations 1-2 times per day (specialty pharmacy)    2. Continue Ventolin HFA 2 puffs every 4-6 hours if needed  3. Continue Epi-PEN if needed  4. Continue antihistamine if needed.       5. Return to clinic in 1 year or earlier if problem   Deavion is doing very well with the combination inhaler and we will give him a Air Duo to replace his Symbicort based upon the expense of his Symbicort.  He will continue to use other medications as needed as noted above.  Assuming he does well with this plan I will see him back in this clinic in 1 year or earlier if there is a problem.  [name], MD Allergy / Immunology [place]

## 2021-09-16 ENCOUNTER — Other Ambulatory Visit: Payer: Self-pay

## 2021-09-16 ENCOUNTER — Encounter: Payer: Self-pay | Admitting: Allergy and Immunology

## 2021-09-16 ENCOUNTER — Ambulatory Visit (AMBULATORY_SURGERY_CENTER): Payer: 59 | Admitting: *Deleted

## 2021-09-16 VITALS — Ht 72.0 in | Wt 161.0 lb

## 2021-09-16 DIAGNOSIS — Z1211 Encounter for screening for malignant neoplasm of colon: Secondary | ICD-10-CM

## 2021-09-16 MED ORDER — NA SULFATE-K SULFATE-MG SULF 17.5-3.13-1.6 GM/177ML PO SOLN: 1.0000 | Freq: Once | ORAL | 0 refills | Status: AC

## 2021-09-16 NOTE — Progress Notes (Signed)
No egg or soy allergy known to patient  No issues known to pt with past sedation with any surgeries or procedures Patient denies ever being told they had issues or difficulty with intubation  No FH of Malignant Hyperthermia Pt is not on diet pills Pt is not on  home 02  Pt is not on blood thinners  Pt denies issues with constipation  No A fib or A flutter  Pt is  vaccinated  for Covid   Due to the COVID-19 pandemic we are asking patients to follow certain guidelines in PV and the [facility]   Pt aware of COVID protocols and [facility] guidelines   PV completed over the phone. Pt verified name, DOB, address and insurance during PV today.  Pt mailed instruction packet with copy of consent form to read and not return, and instructions.  Pt encouraged to call with questions or issues.  If pt has My chart, procedure instructions sent via My Chart    Colonoscopy classification sheet mailed with packet.

## 2021-09-18 ENCOUNTER — Telehealth: Payer: Self-pay | Admitting: Gastroenterology

## 2021-09-18 NOTE — Telephone Encounter (Signed)
Sent prep instructions to patient via Mychart.

## 2021-09-18 NOTE — Telephone Encounter (Signed)
Patient called and stated that he has went to the letter option on Mychart and he does not have his little for his prep instructions. Patient is scheduled for colonoscopy 3/3. Please advise.

## 2021-09-23 ENCOUNTER — Encounter: Payer: Self-pay | Admitting: Gastroenterology

## 2021-09-25 ENCOUNTER — Encounter: Payer: Self-pay | Admitting: Gastroenterology

## 2021-09-25 ENCOUNTER — Other Ambulatory Visit: Payer: Self-pay

## 2021-09-25 ENCOUNTER — Ambulatory Visit (AMBULATORY_SURGERY_CENTER): Payer: 59 | Admitting: Gastroenterology

## 2021-09-25 VITALS — BP 110/72 | HR 58 | Temp 97.8°F | Resp 11 | Ht 72.0 in | Wt 161.0 lb

## 2021-09-25 DIAGNOSIS — K621 Rectal polyp: Secondary | ICD-10-CM | POA: Diagnosis not present

## 2021-09-25 DIAGNOSIS — D122 Benign neoplasm of ascending colon: Secondary | ICD-10-CM

## 2021-09-25 DIAGNOSIS — D128 Benign neoplasm of rectum: Secondary | ICD-10-CM

## 2021-09-25 DIAGNOSIS — Z1211 Encounter for screening for malignant neoplasm of colon: Secondary | ICD-10-CM

## 2021-09-25 MED ORDER — SODIUM CHLORIDE 0.9 % IV SOLN: 500.0000 mL | Freq: Once | INTRAVENOUS | Status: DC

## 2021-09-25 NOTE — Patient Instructions (Signed)
Impression/Recommendations: ? ?[facility] handout given to patient. ? ?Resume previous diet. ?Continue present medications. ?Await pathology results. ? ?Repeat colonoscopy recommended for surveillance.  Date to be determined after pathology results reviewed. ? ?YOU HAD AN ENDOSCOPIC PROCEDURE TODAY AT [facility]:   Refer to the procedure report that was given to you for any specific questions about what was found during the examination.  If the procedure report does not answer your questions, please call your gastroenterologist to clarify.  If you requested that your care partner not be given the details of your procedure findings, then the procedure report has been included in a sealed envelope for you to review at your convenience later. ? ?YOU SHOULD EXPECT: Some feelings of bloating in the abdomen. Passage of more gas than usual.  Walking can help get rid of the air that was put into your GI tract during the procedure and reduce the bloating. If you had a lower endoscopy (such as a colonoscopy or flexible sigmoidoscopy) you may notice spotting of blood in your stool or on the toilet paper. If you underwent a bowel prep for your procedure, you may not have a normal bowel movement for a few days. ? ?Please Note:  You might notice some irritation and congestion in your nose or some drainage.  This is from the oxygen used during your procedure.  There is no need for concern and it should clear up in a day or so. ? ?SYMPTOMS TO REPORT IMMEDIATELY: ? ?Following lower endoscopy (colonoscopy or flexible sigmoidoscopy): ? Excessive amounts of blood in the stool ? Significant tenderness or worsening of abdominal pains ? Swelling of the abdomen that is new, acute ? Fever of 100?F or higher ? ?For urgent or emergent issues, a gastroenterologist can be reached at any hour by calling [phone]. ?Do not use MyChart messaging for urgent concerns.  ? ? ?DIET:  We do recommend a small meal at first, but then you  may proceed to your regular diet.  Drink plenty of fluids but you should avoid alcoholic beverages for 24 hours. ? ?ACTIVITY:  You should plan to take it easy for the rest of today and you should NOT DRIVE or use heavy machinery until tomorrow (because of the sedation medicines used during the test).   ? ?FOLLOW UP: ?Our staff will call the number listed on your records 48-72 hours following your procedure to check on you and address any questions or concerns that you may have regarding the information given to you following your procedure. If we do not reach you, we will leave a message.  We will attempt to reach you two times.  During this call, we will ask if you have developed any symptoms of COVID 19. If you develop any symptoms (ie: fever, flu-like symptoms, shortness of breath, cough etc.) before then, please call [phone].  If you test positive for Covid 19 in the 2 weeks post procedure, please call and report this information to [place].   ? ?If any biopsies were taken you will be contacted by phone or by letter within the next 1-3 weeks.  Please call us at [phone] if you have not heard about the biopsies in 3 weeks.  ? ? ?SIGNATURES/CONFIDENTIALITY: ?You and/or your care partner have signed paperwork which will be entered into your electronic medical record.  These signatures attest to the fact that that the information above on your After Visit Summary has been reviewed and is understood.  Full responsibility  of the confidentiality of this discharge information lies with you and/or your care-partner.  ?

## 2021-09-25 NOTE — Progress Notes (Signed)
Pt.held in PACU until he and his wife felt he was more fully awake prior to D/C. ?

## 2021-09-25 NOTE — Progress Notes (Signed)
Pt's states no medical or surgical changes since previsit or office visit. 

## 2021-09-25 NOTE — Progress Notes (Signed)
Called to room to assist during endoscopic procedure.  Patient ID and intended procedure confirmed with present staff. Received instructions for my participation in the procedure from the performing physician.  

## 2021-09-25 NOTE — Progress Notes (Signed)
History and Physical: ? This patient presents for endoscopic testing for: ?Encounter Diagnosis  ?Name Primary?  ? Special screening for malignant neoplasms, colon Yes  ? ? ?First screening exam. ?Patient denies chronic abdominal pain, rectal bleeding, constipation or diarrhea. ? ? ?ROS: ?Patient denies chest pain or shortness of breath ? ? ?Past Medical History: ?Past Medical History:  ?Diagnosis Date  ? Allergy   ? seasonal,dogs,cats  ? Asthma   ? Hymenoptera allergy   ? 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 ? ? ?Past Surgical History: ?Past Surgical History:  ?Procedure Laterality Date  ? TYMPANOSTOMY TUBE PLACEMENT    ? ? ?Allergies: ?Allergies  ?Allergen Reactions  ? Bee Venom Anaphylaxis  ? ? ?Outpatient Meds: ?Current Outpatient Medications  ?Medication Sig Dispense Refill  ? Ascorbic Acid (VITAMIN C PO) Take 1,000 mg by mouth 2 (two) times daily at 10 AM and 5 PM. Take    ? budesonide-formoterol (SYMBICORT) 160-4.5 MCG/ACT inhaler INHALE 2 PUFFS INTO THE LUNGS DAILY TO PREVENT COUGH OR WHEEZE, RINSE, GARGLE, AND SPIT AFTER USE 10.2 g 3  ? Cholecalciferol (VITAMIN D3) 25 MCG (1000 UT) CAPS Take by mouth daily.    ? fexofenadine (ALLEGRA) 180 MG tablet Take 1 tablet (180 mg total) by mouth daily. 30 tablet 5  ? OVER THE COUNTER MEDICATION 500 mg 2 (two) times daily. Tumeric    ? albuterol (VENTOLIN HFA) 108 (90 Base) MCG/ACT inhaler INHALE 2 PUFFS TWICE A DAY (Patient taking differently: as needed. INHALE 2 PUFFS TWICE A DAY) 18 each 2  ? EPINEPHrine 0.3 mg/0.3 mL IJ SOAJ injection INJECT 0.3 MLS (0.3 MG TOTAL) INTO THE MUSCLE ONCE FOR 1 DOSE. (Patient not taking: Reported on 09/25/2021) 2 each 1  ? Fluticasone-Salmeterol (AIRDUO RESPICLICK 704/88) 891-69 MCG/ACT AEPB 1 inhalations 1-2 times per day. (Patient taking differently: as needed. 1 inhalations 1-2 times per day.) 1 each 2  ? ?Current Facility-Administered Medications  ?Medication Dose Route Frequency Provider Last Rate Last Admin  ? 0.9 %  sodium  chloride infusion  500 mL Intravenous Once Doran Stabler, MD      ? ? ? ? ?___________________________________________________________________ ?Objective  ? ?Exam: ? ?BP 116/71   Pulse 65   Temp 97.8 ?F (36.6 ?C)   Ht 6' (1.829 m)   Wt 161 lb (73 kg)   SpO2 100%   BMI 21.84 kg/m?  ? ?CV: RRR without murmur, S1/S2 ?Resp: clear to auscultation bilaterally, normal RR and effort noted ?GI: soft, no tenderness, with active bowel sounds. ? ? ?Assessment: ?Encounter Diagnosis  ?Name Primary?  ? Special screening for malignant neoplasms, colon Yes  ? ? ? ?Plan: ?Colonoscopy ? The benefits and risks of the planned procedure were described in detail with the patient or (when appropriate) their health care proxy.  Risks were outlined as including, but not limited to, bleeding, infection, perforation, adverse medication reaction leading to cardiac or pulmonary decompensation, pancreatitis (if ERCP).  The limitation of incomplete mucosal visualization was also discussed.  No guarantees or warranties were given. ? ? ? ?The patient is appropriate for an endoscopic procedure in the ambulatory setting. ? ? - Wilfrid Lund, MD ? ? ? ? ?

## 2021-09-25 NOTE — Op Note (Signed)
[facility] ?Patient Name: Brad Price ?Procedure Date: 09/25/2021 10:01 AM ?MRN: [id number] ?Endoscopist: Estill Cotta. Loletha Carrow , MD ?Age: 51 ?Referring MD:  ?Date of Birth: [birth year] ?Gender: Male ?Account #: [id number] ?Procedure:                Colonoscopy ?Indications:              Screening for colorectal malignant neoplasm, This  ?                          is the patient's first colonoscopy ?Medicines:                Monitored Anesthesia Care ?Procedure:                Pre-Anesthesia Assessment: ?                          - Prior to the procedure, a History and Physical  ?                          was performed, and patient medications and  ?                          allergies were reviewed. The patient's tolerance of  ?                          previous anesthesia was also reviewed. The risks  ?                          and benefits of the procedure and the sedation  ?                          options and risks were discussed with the patient.  ?                          All questions were answered, and informed consent  ?                          was obtained. Prior Anticoagulants: The patient has  ?                          taken no previous anticoagulant or antiplatelet  ?                          agents. ASA Grade Assessment: II - A patient with  ?                          mild systemic disease. After reviewing the risks  ?                          and benefits, the patient was deemed in  ?                          satisfactory condition to undergo the procedure. ?  After obtaining informed consent, the colonoscope  ?                          was passed under direct vision. Throughout the  ?                          procedure, the patient's blood pressure, pulse, and  ?                          oxygen saturations were monitored continuously. The  ?                          [place] [id number] was introduced through the  ?                          anus and advanced to the the  cecum, identified by  ?                          appendiceal orifice and ileocecal valve. The  ?                          colonoscopy was performed without difficulty. The  ?                          patient tolerated the procedure well. The quality  ?                          of the bowel preparation was good. The ileocecal  ?                          valve, appendiceal orifice, and rectum were  ?                          photographed. The bowel preparation used was SUPREP. ?Scope In: 10:02:20 AM ?Scope Out: 10:20:19 AM ?Scope Withdrawal Time: 0 hours 14 minutes 29 seconds  ?Total Procedure Duration: 0 hours 17 minutes 59 seconds  ?Findings:                 The perianal and digital rectal examinations were  ?                          normal. ?                          Repeat examination of right colon under NBI  ?                          performed. ?                          Two sessile polyps were found in the ascending  ?                          colon. The polyps were 3 to 5 mm in size. These  ?  polyps were removed with a cold snare. Resection  ?                          and retrieval were complete. ?                          A 6 mm polyp was found in the proximal rectum. The  ?                          polyp was sessile. The polyp was removed with a  ?                          cold snare. Resection and retrieval were complete. ?                          The exam was otherwise without abnormality on  ?                          direct and retroflexion views. ?Complications:            No immediate complications. ?Estimated Blood Loss:     Estimated blood loss was minimal. ?Impression:               - Two 3 to 5 mm polyps in the ascending colon,  ?                          removed with a cold snare. Resected and retrieved. ?                          - One 6 mm polyp in the proximal rectum, removed  ?                          with a cold snare. Resected and retrieved. ?                           - The examination was otherwise normal on direct  ?                          and retroflexion views. ?Recommendation:           - Patient has a contact number available for  ?                          emergencies. The signs and symptoms of potential  ?                          delayed complications were discussed with the  ?                          patient. Return to normal activities tomorrow.  ?                          Written discharge instructions were provided to the  ?  patient. ?                          - Resume previous diet. ?                          - Continue present medications. ?                          - Await pathology results. ?                          - Repeat colonoscopy is recommended for  ?                          surveillance. The colonoscopy date will be  ?                          determined after pathology results from today's  ?                          exam become available for review. ?Vianey Caniglia L. Loletha Carrow, MD ?09/25/2021 10:26:25 AM ?This report has been signed electronically. ?

## 2021-09-25 NOTE — Progress Notes (Signed)
Report given to PACU, vss 

## 2021-09-29 ENCOUNTER — Telehealth: Payer: Self-pay | Admitting: *Deleted

## 2021-09-29 NOTE — Telephone Encounter (Signed)
?  Follow up Call- ? ?Call back number 09/25/2021  ?Post procedure Call Back phone  # [phone]  ?Permission to leave phone message Yes  ?Some recent data might be hidden  ?  ? ?Patient questions: ? ?Do you have a fever, pain , or abdominal swelling? No. ?Pain Score  0 * ? ?Have you tolerated food without any problems? Yes.   ? ?Have you been able to return to your normal activities? Yes.   ? ?Do you have any questions about your discharge instructions: ?Diet   No. ?Medications  No. ?Follow up visit  No. ? ?Do you have questions or concerns about your Care? No. ? ?Actions: ?* If pain score is 4 or above: ?No action needed, pain <4. ? ? ?

## 2021-09-30 ENCOUNTER — Encounter: Payer: Self-pay | Admitting: Gastroenterology

## 2021-10-01 ENCOUNTER — Telehealth: Payer: Self-pay | Admitting: *Deleted

## 2021-10-01 NOTE — Telephone Encounter (Signed)
?  Follow up Call- ? ?Call back number 09/25/2021  ?Post procedure Call Back phone  # [phone]  ?Permission to leave phone message Yes  ?Some recent data might be hidden  ?  ? ?Patient questions: ? ?Do you have a fever, pain , or abdominal swelling? No. ?Pain Score  0 * ? ?Have you tolerated food without any problems? Yes.   ? ?Have you been able to return to your normal activities? Yes.   ? ?Do you have any questions about your discharge instructions: ?Diet   No. ?Medications  No. ?Follow up visit  No. ? ?Do you have questions or concerns about your Care? No. ? ?Actions: ?* If pain score is 4 or above: ?No action needed, pain <4. ? ?Have you developed a fever since your procedure? no ? ?2.   Have you had an respiratory symptoms (SOB or cough) since your procedure? no ? ?3.   Have you tested positive for COVID 19 since your procedure no ? ?4.   Have you had any family members/close contacts diagnosed with the COVID 19 since your procedure?  no ? ? ?If yes to any of these questions please route to Joylene John, RN and Joella Prince, RN  ? ? ?

## 2021-10-26 ENCOUNTER — Other Ambulatory Visit: Payer: Self-pay | Admitting: Allergy and Immunology

## 2022-02-02 ENCOUNTER — Telehealth: Payer: Self-pay | Admitting: Allergy and Immunology

## 2022-02-02 MED ORDER — FLUTICASONE-SALMETEROL 232-14 MCG/ACT IN AEPB: INHALATION_SPRAY | RESPIRATORY_TRACT | 5 refills | Status: DC

## 2022-02-02 NOTE — Telephone Encounter (Signed)
Spoke to patient to let him know that the office no longer has the coupons for the AirDuo. However, we can still send it to the specialty pharmacy for the discount. Advised patient that we do not know how much it will cost with his insurance and did he still want the medication to be sent. Patient said yes and "we will see how much it cost when they fill it".

## 2022-02-02 NOTE — Telephone Encounter (Signed)
Patient is requesting a refill for his airduo digihaler. Patient states he received coupon last time that allowed the copay for inhaler to be $20. Patient is requesting same coupon.   [place], [place] [id number]   Patient is requesting a call back, [phone]

## 2022-03-02 NOTE — Telephone Encounter (Signed)
Called and spoke to patient and he express that he is able to afford the Airduo 507 Respiclick. Patient would like to know if there is a difference between the regular air duo and the respiclick. I advised that I dint think there was a difference but I would ask to be sure. Please advise.

## 2022-03-02 NOTE — Telephone Encounter (Signed)
Patient was told by pharmacy that they have been trying to reach out to our office in regards to the discount. Patient is following up on this.   Patient would like a call back, [phone]

## 2022-03-04 ENCOUNTER — Other Ambulatory Visit: Payer: Self-pay | Admitting: Allergy and Immunology

## 2022-03-04 NOTE — Telephone Encounter (Signed)
Patient called requesting the respiclick air duo as he can use a coupon to get it cheaper.   [place].

## 2022-03-05 MED ORDER — FLUTICASONE-SALMETEROL 232-14 MCG/ACT IN AEPB: 2.0000 | INHALATION_SPRAY | Freq: Two times a day (BID) | RESPIRATORY_TRACT | 5 refills | Status: DC

## 2022-03-05 NOTE — Addendum Note (Signed)
Addended by: Herbie Drape on: 03/05/2022 02:17 PM   Modules accepted: Orders

## 2022-03-05 NOTE — Telephone Encounter (Signed)
Patient called stating the his prescription was sent to the wrong pharmacy and he would like it send to [facility] on [address]. Prescription has been sent to the requested pharmacy.

## 2022-04-01 ENCOUNTER — Other Ambulatory Visit: Payer: Self-pay | Admitting: Allergy and Immunology

## 2022-09-16 ENCOUNTER — Other Ambulatory Visit: Payer: Self-pay | Admitting: Allergy and Immunology

## 2022-09-28 ENCOUNTER — Other Ambulatory Visit: Payer: Self-pay | Admitting: Allergy and Immunology

## 2023-01-10 ENCOUNTER — Ambulatory Visit: Payer: 59 | Admitting: Family Medicine

## 2023-01-10 NOTE — Progress Notes (Deleted)
   [address] [place] [address] Dept: [phone]  FOLLOW UP NOTE  Patient ID: Brad Price, male    DOB: 10/27/1970  Age: 52 y.o. MRN: [id number] Date of Office Visit: 01/10/2023  Assessment  Chief Complaint: No chief complaint on file.  HPI Brad Price is a 52 year old male who presents to the clinic for follow-up visit.  He was last seen in this clinic on 09/15/2021 by Dr. Lucie Leather for evaluation of asthma, allergic rhinitis, and hymenoptera allergy.   Drug Allergies:  Allergies  Allergen Reactions   Bee Venom Anaphylaxis    Physical Exam: There were no vitals taken for this visit.   Physical Exam  Diagnostics:    Assessment and Plan: No diagnosis found.  No orders of the defined types were placed in this encounter.   There are no Patient Instructions on file for this visit.  No follow-ups on file.    Thank you for the opportunity to care for this patient.  Please do not hesitate to contact me with questions.  Thermon Leyland, FNP [facility]
